# Patient Record
Sex: Female | Born: 1960 | Race: White | Hispanic: No | Marital: Married | Smoking: Never smoker
Health system: Southern US, Community
[De-identification: ages and names within clinical notes are randomized; demographics above are authoritative.]

## PROBLEM LIST (undated history)

## (undated) DIAGNOSIS — T148XXA Other injury of unspecified body region, initial encounter: Secondary | ICD-10-CM

## (undated) DIAGNOSIS — E7800 Pure hypercholesterolemia, unspecified: Secondary | ICD-10-CM

## (undated) DIAGNOSIS — Z9889 Other specified postprocedural states: Secondary | ICD-10-CM

## (undated) DIAGNOSIS — D649 Anemia, unspecified: Secondary | ICD-10-CM

## (undated) DIAGNOSIS — D696 Thrombocytopenia, unspecified: Secondary | ICD-10-CM

## (undated) DIAGNOSIS — R112 Nausea with vomiting, unspecified: Secondary | ICD-10-CM

## (undated) HISTORY — DX: Other specified postprocedural states: Z98.890

## (undated) HISTORY — DX: Anemia, unspecified: D64.9

## (undated) HISTORY — PX: SKIN SURGERY: SHX2413

## (undated) HISTORY — DX: Other specified postprocedural states: R11.2

## (undated) HISTORY — DX: Other injury of unspecified body region, initial encounter: T14.8XXA

## (undated) HISTORY — DX: Pure hypercholesterolemia, unspecified: E78.00

## (undated) HISTORY — DX: Thrombocytopenia, unspecified: D69.6

## (undated) SURGERY — Surgical Case
Anesthesia: *Unknown

---

## 2002-03-10 HISTORY — PX: HEMORROIDECTOMY: SUR656

## 2007-12-31 ENCOUNTER — Inpatient Hospital Stay (HOSPITAL_COMMUNITY): Admission: EM | Admit: 2007-12-31 | Discharge: 2008-01-03 | Payer: Self-pay | Admitting: Emergency Medicine

## 2008-01-01 ENCOUNTER — Ambulatory Visit: Payer: Self-pay | Admitting: Infectious Diseases

## 2009-03-10 HISTORY — PX: DILATION AND CURETTAGE OF UTERUS: SHX78

## 2010-01-24 ENCOUNTER — Ambulatory Visit: Payer: Self-pay | Admitting: Obstetrics & Gynecology

## 2010-03-01 ENCOUNTER — Ambulatory Visit (HOSPITAL_COMMUNITY)
Admission: RE | Admit: 2010-03-01 | Discharge: 2010-03-01 | Payer: Self-pay | Source: Home / Self Care | Attending: Obstetrics & Gynecology | Admitting: Obstetrics & Gynecology

## 2010-05-20 LAB — CBC
MCH: 27.8 pg (ref 26.0–34.0)
MCV: 84.7 fL (ref 78.0–100.0)
Platelets: 284 10*3/uL (ref 150–400)
WBC: 9.6 10*3/uL (ref 4.0–10.5)

## 2010-07-23 NOTE — Discharge Summary (Signed)
Alice Adams, Alice Adams NO.:  [id number]   MEDICAL RECORD NO.:  [id number]           PATIENT TYPE:   LOCATION:                                 FACILITY:   PHYSICIAN:  Renee Ramus, MD       DATE OF BIRTH:  07/21/1960   DATE OF ADMISSION:  DATE OF DISCHARGE:                               DISCHARGE SUMMARY   PRIMARY DISCHARGE DIAGNOSIS:  Malaria.   SECONDARY DIAGNOSES:  1. Thrombocytopenia.  2. Anemia.  3. Elevated LFTs.   HOSPITAL COURSE:  1. Malaria.  The patient is a 50 year old female admitted to the      emergency department after her primary care physician had diagnosed      her with malaria.  The patient had been in [place] for the      [employer] prior to being admitted.  The patient has a      longstanding history of abdominal pain, nausea, and vomiting, but      no specific fevers.  Upon admission, the patient was found to be      thrombocytopenic as well as anemic.  The patient was placed on      doxycycline and quinine sulfate.  She received consult from      Infectious Disease.  The patient's thrombocytopenia has improved.      Her symptoms have abated and she will be continued on this      antibiotic regimen for 2 weeks postdischarge.  The patient will      follow up with [facility] postdischarge within 1 week.  The patient      is now stable for discharge with, pending results G6PD level as      well as urine hCG.  2. Thrombocytopenia as above.  3. Anemia as above.   LABS OF NOTE:  1. Anemia with hemoglobin of 9.3 and hematocrit 28.3.  2. Thrombocytopenia initially with platelet count of 51, increasing to      102.  3. Initial hypokalemia with potassium of 3.2, increasing to 3.5 on the      day of discharge.  4. Elevated blood glucose of 104, decreasing to 86 on the day of      discharge.  5. Elevated LFTs with AST of 93 and ALT of 83, decreasing to 68 and 58      respectively on the day of discharge.  6. Hypoalbuminemia  with an albumin of 2.2.  7. Mild ketonuria in the urine.   STUDIES:  1. Chest x-ray showing no active cardiopulmonary disease.  2. Abdominal ultrasound showing no acute findings with no evidence of      splenomegaly and no evidence of acute cholecystitis, although      gallbladder sludge was noted.   DISCHARGE MEDICATIONS:  1. Quinine sulfate 648 mg p.o. t.i.d. x2 weeks.  2. Doxycycline 100 mg p.o. b.i.d. x2 weeks.  3. Temazepam 30 mg p.o. nightly.   There were no labs or studies pending at the time of discharge with the  exception of G6PD level.  The patient is in  stable condition and ready  for discharge.   TIME SPENT:  35 minutes.      Renee Ramus, MD  Electronically Signed     JF/MEDQ  D:  01/03/2008  T:  01/03/2008  Job:  [id number]

## 2010-07-23 NOTE — H&P (Signed)
Alice Adams, Alice Adams                ACCOUNT NO.:  [id number]   MEDICAL RECORD NO.:  [id number]          PATIENT TYPE:  INP   LOCATION:  1823                         FACILITY:  [facility]   PHYSICIAN:  Della Goo, M.D. DATE OF BIRTH:  10/12/1960   DATE OF ADMISSION:  12/31/2007  DATE OF DISCHARGE:                              HISTORY & PHYSICAL   PRIMARY CARE PHYSICIAN:  Unassigned.   CHIEF COMPLAINT:  Sick for 3 months.   HISTORY OF PRESENT ILLNESS:  This is a 50 year old female who was seen  by her primary care physician secondary to symptoms of illness, malaise,  bone pain and dark urine that she reports having over the past 3 months.  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  She had been in [place]  for 6 months.  She reports being seen by her primary care physician and  having laboratory studies sent.  She received a call from her primary  care physician to report to the emergency department after the findings  revealed malaria.  The patient also was found to have low platelets.  The patient was started on doxycycline and had one dose of doxycycline  100 mg prior to arriving.   PAST MEDICAL HISTORY:  None.   PAST SURGICAL HISTORY:  History of a hemorrhoidectomy.   MEDICATIONS:  1. Doxycycline 100 mg, one dose taken.  2. Temazepam 30 mg one p.o. q.h.s. p.r.n.   ALLERGIES:  NO KNOWN DRUG ALLERGIES   SOCIAL HISTORY:  She is a nonsmoker and she reports drinking one beer  occasionally.   FAMILY HISTORY:  Noncontributory.   REVIEW OF SYSTEMS:  Pertinents are mentioned above.  The patient  actually denies having fevers or chills.  She denies having any  diarrhea, dyspepsia, chest pain or shortness of breath.  She does have  weakness, malaise, fatigue and the symptoms mentioned above.   PHYSICAL EXAMINATION:  GENERAL:  This is a 50 year old thin, well-  developed female in discomfort, but no  acute distress.  VITAL SIGNS:  Temperature is 98.1, blood pressure 101/66, heart rate  102, respirations 18, O2 sats 96-99%.  HEENT:  Normocephalic, atraumatic.  Pupils are equally round and  reactive to light.  Extraocular movements are intact.  There is no  scleral icterus.  Oropharynx is clear.  NECK:  Supple, full range of motion.  No thyromegaly, adenopathy or  jugular venous distention.  CARDIOVASCULAR:  Mild tachycardia.  No murmurs, gallops or rubs.  LUNGS:  Clear to auscultation bilaterally.  ABDOMEN:  Positive bowel sounds, soft, nontender, nondistended.  There  is no palpable hepatosplenomegaly.  EXTREMITIES:  Without cyanosis, clubbing or edema.  NEUROLOGIC:  Nonfocal.   ASSESSMENT:  A 50 year old female being admitted with:  1. Reported diagnosis of malaria.  2. Thrombocytopenia.  3. Nausea and vomiting.  4. Weakness.   PLAN:  Laboratory studies are pending at this time.  A CBC along with  review of the blood smear will be performed.  Urine HCG will be sent  along  with a G6PD level.  The patient will be started on therapy of  quinine sulfate and doxycycline to treat an uncomplicated form of  falciparum malaria.  The patient will also be placed in isolation at  this time.  She will be placed on other medications to treat her  symptoms along with IV fluids for rehydration therapy.  Pain control  therapy and antiemetics have also been ordered.  Her blood count,  platelets and electrolytes will be monitored.  Infectious diseases will  also be consulted.      Della Goo, M.D.  Electronically Signed     HJ/MEDQ  D:  12/31/2007  T:  01/01/2008  Job:  [id number]

## 2010-10-24 ENCOUNTER — Other Ambulatory Visit: Payer: Self-pay | Admitting: Obstetrics & Gynecology

## 2010-10-29 ENCOUNTER — Ambulatory Visit: Payer: Self-pay | Admitting: Obstetrics & Gynecology

## 2010-12-10 LAB — DIFFERENTIAL
Basophils Absolute: 0.1
Basophils Absolute: 0.1
Basophils Relative: 1
Basophils Relative: 1
Basophils Relative: 1
Eosinophils Absolute: 0
Eosinophils Absolute: 0.1
Eosinophils Relative: 0
Eosinophils Relative: 1
Eosinophils Relative: 1
Lymphocytes Relative: 13
Lymphocytes Relative: 29
Lymphs Abs: 0.9
Lymphs Abs: 1.8
Monocytes Absolute: 0.7
Monocytes Absolute: 1.1 — ABNORMAL HIGH
Monocytes Relative: 13 — ABNORMAL HIGH
Monocytes Relative: 17 — ABNORMAL HIGH
Monocytes Relative: 9
Neutro Abs: 3.2
Neutro Abs: 5.6
Neutrophils Relative %: 52
Neutrophils Relative %: 62
Neutrophils Relative %: 77
Smear Review: DECREASED

## 2010-12-10 LAB — COMPREHENSIVE METABOLIC PANEL WITH GFR
ALT: 54 — ABNORMAL HIGH
AST: 57 — ABNORMAL HIGH
Albumin: 2 — ABNORMAL LOW
Alkaline Phosphatase: 56
BUN: 5 — ABNORMAL LOW
CO2: 25
Calcium: 7.7 — ABNORMAL LOW
Chloride: 102
Creatinine, Ser: 0.66
GFR calc Af Amer: >60
GFR calc non Af Amer: >60
Glucose, Bld: 114 — ABNORMAL HIGH
Potassium: 3.3 — ABNORMAL LOW
Sodium: 132 — ABNORMAL LOW
Total Bilirubin: 0.6
Total Protein: 5.1 — ABNORMAL LOW

## 2010-12-10 LAB — BASIC METABOLIC PANEL WITH GFR
BUN: 12
CO2: 25
Calcium: 8.1 — ABNORMAL LOW
Chloride: 95 — ABNORMAL LOW
Creatinine, Ser: 0.79
GFR calc Af Amer: >60
GFR calc non Af Amer: >60
Glucose, Bld: 97
Potassium: 3.2 — ABNORMAL LOW
Sodium: 130 — ABNORMAL LOW

## 2010-12-10 LAB — COMPREHENSIVE METABOLIC PANEL
ALT: 58 — ABNORMAL HIGH
ALT: 83 — ABNORMAL HIGH
AST: 68 — ABNORMAL HIGH
AST: 93 — ABNORMAL HIGH
Albumin: 2.2 — ABNORMAL LOW
BUN: 4 — ABNORMAL LOW
Calcium: 8.5
Creatinine, Ser: 0.63
GFR calc Af Amer: >60
Potassium: 3.5
Sodium: 135
Total Bilirubin: 0.8
Total Protein: 5.6 — ABNORMAL LOW

## 2010-12-10 LAB — URINALYSIS, ROUTINE W REFLEX MICROSCOPIC
Bilirubin Urine: NEGATIVE
Glucose, UA: NEGATIVE
Hgb urine dipstick: NEGATIVE
Ketones, ur: 15 — AB
Nitrite: NEGATIVE
Protein, ur: NEGATIVE
Specific Gravity, Urine: 1.011
Urobilinogen, UA: 1
pH: 6

## 2010-12-10 LAB — CBC
HCT: 28.3 — ABNORMAL LOW
HCT: 35.5 — ABNORMAL LOW
HCT: 35.5 — ABNORMAL LOW
Hemoglobin: 12
Hemoglobin: 12.1
Hemoglobin: 9.4 — ABNORMAL LOW
MCHC: 33.4
MCHC: 33.9
MCHC: 34
MCV: 82.4
MCV: 82.4
MCV: 83.1
MCV: 83.7
Platelets: 102 — ABNORMAL LOW
Platelets: 51 — ABNORMAL LOW
Platelets: DECREASED
RBC: 3.32 — ABNORMAL LOW
RBC: 3.38 — ABNORMAL LOW
RBC: 4.27
RBC: 4.31
RDW: 14.6
RDW: 14.6
RDW: 14.9
RDW: 15
WBC: 5.4
WBC: 6.1
WBC: 6.3
WBC: 7.3

## 2010-12-10 LAB — HEPATIC FUNCTION PANEL
ALT: 73 — ABNORMAL HIGH
Alkaline Phosphatase: 75
Indirect Bilirubin: 0.7
Total Bilirubin: 1
Total Protein: 6.4

## 2010-12-10 LAB — RETICULOCYTES
RBC.: 4.36
Retic Count, Absolute: 43.6

## 2010-12-10 LAB — HCG, SERUM, QUALITATIVE: Preg, Serum: NEGATIVE

## 2010-12-10 LAB — PATHOLOGIST SMEAR REVIEW

## 2011-01-13 ENCOUNTER — Encounter (HOSPITAL_COMMUNITY): Payer: Self-pay | Admitting: Pharmacy Technician

## 2011-01-15 ENCOUNTER — Other Ambulatory Visit: Payer: Self-pay | Admitting: Obstetrics & Gynecology

## 2011-01-15 ENCOUNTER — Encounter (HOSPITAL_COMMUNITY): Payer: Self-pay

## 2011-01-15 ENCOUNTER — Encounter (HOSPITAL_COMMUNITY)

## 2011-01-15 LAB — CBC
MCV: 86.2 fL (ref 78.0–100.0)
Platelets: 301 10*3/uL (ref 150–400)
RBC: 4.26 MIL/uL (ref 3.87–5.11)
WBC: 8.6 10*3/uL (ref 4.0–10.5)

## 2011-01-15 LAB — COMPREHENSIVE METABOLIC PANEL
ALT: 13 U/L (ref 0–35)
AST: 20 U/L (ref 0–37)
Albumin: 3.7 g/dL (ref 3.5–5.2)
Alkaline Phosphatase: 82 U/L (ref 39–117)
GFR calc Af Amer: >90 mL/min (ref >90)
Glucose, Bld: 88 mg/dL (ref 70–99)
Potassium: 4.2 mEq/L (ref 3.5–5.1)
Sodium: 136 mEq/L (ref 135–145)
Total Protein: 7.4 g/dL (ref 6.0–8.3)

## 2011-01-15 NOTE — Patient Instructions (Signed)
Alice Adams  01/15/2011   Your procedure is scheduled on: 01/24/11  Report to [facility] at 5:30 AM.  Call this number if you have problems the morning of surgery: [phone]   Remember:   Do not eat food:After Midnight.  Do not drink clear liquids: After Midnight.  Take these medicines the morning of surgery with A SIP OF WATER: NONE   Do not wear jewelry, make-up or nail polish.  Do not wear lotions, powders, or perfumes. You may wear deodorant.  Do not shave 48 hours prior to surgery.  Do not bring valuables to the hospital.  Contacts, dentures or bridgework may not be worn into surgery.  Leave suitcase in the car. After surgery it may be brought to your room.  For patients admitted to the hospital, checkout time is 11:00 AM the day of discharge.   Patients discharged the day of surgery will not be allowed to drive home.  Name and phone number of your driver:   Special Instructions: CHG Shower Use Special Wash: 1/2 bottle night before surgery and 1/2 bottle morning of surgery.   Please read over the following fact sheets that you were given: MRSA Information

## 2011-01-23 ENCOUNTER — Encounter (HOSPITAL_COMMUNITY): Payer: Self-pay | Admitting: Obstetrics & Gynecology

## 2011-01-23 DIAGNOSIS — D259 Leiomyoma of uterus, unspecified: Principal | ICD-10-CM | POA: Diagnosis present

## 2011-01-23 MED ORDER — CEFAZOLIN SODIUM 1-5 GM-% IV SOLN
1.0000 g | INTRAVENOUS | Status: AC
  Administered 2011-01-24: 1 g via INTRAVENOUS
  Filled 2011-01-23: qty 50

## 2011-01-23 NOTE — H&P (Signed)
  Subjective:  Alice Adams is a 50 y.o. parous female.  The gives a several year h/o of abnormal uterine bleeding.  Associated symptoms include left-sided pelvic pain.  Evaluation to date: lab: CBC with diff, Comprehensive metabolic panel and PT/PTT, endometrial biopsy: negative, D & C: negative, hysteroscopy: negative, pelvic ultrasound: positive for a 3 cm fundal myoma and pelvic CT scan: positive for a uterine myoma. Treatment to date: provera  Pertinent Gyn History: Menses flow is moderate and irregular Bleeding: dysfunctional uterine bleeding  Last pap: normal Date: 11/11  Patient Active Problem List  Diagnoses Date Noted  . Leiomyoma of uterus, unspecified 01/23/2011   Past Medical History  Diagnosis Date  . PONV (postoperative nausea and vomiting)   . Elevated cholesterol   . Asthma     NO PROBLEM SINCE 2006 -INVIRONMENTAL - SINCE MOVING FROM [place] TO [place]   . Malaria     TREATED FOR MALARIA 2009  . Anemia   . Skin abrasion     SMALL SCABS ON L LOWER LEG,L THIGH,RT THIGH - UNK CAUSE  . Thrombocytopenia     Past Surgical History  Procedure Date  . Dilation and curettage of uterus 2011  . Hemorroidectomy 2004    No prescriptions prior to admission   No Known Allergies  History  Substance Use Topics  . Smoking status: Never Smoker   . Smokeless tobacco: Not on file  . Alcohol Use: Yes     OCCASSIONAL    History reviewed. No pertinent family history.   Review of Systems Pertinent items are noted in HPI.    Objective:   Vital signs in last 24 hours:    General:   alert  Skin:   normal  HEENT:  PERRLA  Lungs:   clear to auscultation bilaterally  Heart:   regular rate and rhythm, S1, S2 normal, no murmur, click, rub or gallop  Breasts:   normal without suspicious masses, skin or nipple changes or axillary nodes  Abdomen:  soft, non-tender; bowel sounds normal; no masses,  no organomegaly  Pelvis:  Vulva and vagina appear normal. Bimanual exam reveals  normal uterus and adnexa.                                     Assessment/Plan:  Peritmenopausal with continued, irregular bleeding. Likely DUB/small fibroid uterus.  Remote h/o mild endometrial hyperplasia. A total robotic hysterectomy, possible BSO is planned    I had a lengthy discussion with the patient regarding her bleeding and consideration for hysterectomy.  Procedure, risks, reasons, benefits and complications (including injury to bowel, bladder, major blood vessel, ureter, bleeding, possibility of transfusion, infection, or fistula formation) were reviewed in detail. Consent was signed and preop testing was ordered.  Instructions were reviewed, including NPO after midnight.

## 2011-01-24 ENCOUNTER — Encounter (HOSPITAL_COMMUNITY)
Admission: RE | Disposition: A | Discharge status: Home / Self Care | Payer: Self-pay | Source: Ambulatory Visit | Attending: Obstetrics & Gynecology

## 2011-01-24 ENCOUNTER — Encounter (HOSPITAL_COMMUNITY): Payer: Self-pay | Admitting: Certified Registered Nurse Anesthetist

## 2011-01-24 ENCOUNTER — Inpatient Hospital Stay (HOSPITAL_COMMUNITY)
Admission: RE | Admit: 2011-01-24 | Discharge: 2011-01-25 | DRG: 743 | Disposition: A | Discharge status: Home / Self Care | Source: Ambulatory Visit | Attending: Obstetrics & Gynecology | Admitting: Obstetrics & Gynecology

## 2011-01-24 ENCOUNTER — Ambulatory Visit (HOSPITAL_COMMUNITY): Admitting: Certified Registered Nurse Anesthetist

## 2011-01-24 ENCOUNTER — Other Ambulatory Visit: Payer: Self-pay | Admitting: Obstetrics & Gynecology

## 2011-01-24 ENCOUNTER — Encounter (HOSPITAL_COMMUNITY): Payer: Self-pay | Admitting: *Deleted

## 2011-01-24 ENCOUNTER — Encounter (HOSPITAL_COMMUNITY): Payer: Self-pay | Admitting: Surgery

## 2011-01-24 DIAGNOSIS — Z8613 Personal history of malaria: Secondary | ICD-10-CM

## 2011-01-24 DIAGNOSIS — D259 Leiomyoma of uterus, unspecified: Principal | ICD-10-CM

## 2011-01-24 DIAGNOSIS — E7800 Pure hypercholesterolemia, unspecified: Secondary | ICD-10-CM | POA: Diagnosis present

## 2011-01-24 HISTORY — PX: ROBOTIC ASSISTED LAP VAGINAL HYSTERECTOMY: SHX2362

## 2011-01-24 LAB — CBC
HCT: 37.3 % (ref 36.0–46.0)
Hemoglobin: 12.4 g/dL (ref 12.0–15.0)
MCV: 84.8 fL (ref 78.0–100.0)
RBC: 4.4 MIL/uL (ref 3.87–5.11)
WBC: 18.5 10*3/uL — ABNORMAL HIGH (ref 4.0–10.5)

## 2011-01-24 LAB — BASIC METABOLIC PANEL
BUN: 7 mg/dL (ref 6–23)
CO2: 22 mEq/L (ref 19–32)
Chloride: 101 mEq/L (ref 96–112)
Creatinine, Ser: 0.45 mg/dL — ABNORMAL LOW (ref 0.50–1.10)
Potassium: 3.8 mEq/L (ref 3.5–5.1)

## 2011-01-24 LAB — TYPE AND SCREEN: Antibody Screen: NEGATIVE

## 2011-01-24 SURGERY — ROBOTIC ASSISTED LAPAROSCOPIC VAGINAL HYSTERECTOMY
Anesthesia: General | Site: Vagina | Wound class: Clean Contaminated

## 2011-01-24 MED ORDER — CEFAZOLIN SODIUM 1-5 GM-% IV SOLN
INTRAVENOUS | Status: AC
  Filled 2011-01-24: qty 50

## 2011-01-24 MED ORDER — DEXAMETHASONE SODIUM PHOSPHATE 10 MG/ML IJ SOLN
INTRAMUSCULAR | Status: DC | PRN
  Administered 2011-01-24: 10 mg via INTRAVENOUS

## 2011-01-24 MED ORDER — STERILE WATER FOR IRRIGATION IR SOLN
Status: DC | PRN
  Administered 2011-01-24: 1500 mL

## 2011-01-24 MED ORDER — LACTATED RINGERS IV SOLN: INTRAVENOUS | Status: DC

## 2011-01-24 MED ORDER — KETOROLAC TROMETHAMINE 30 MG/ML IJ SOLN
30.0000 mg | Freq: Four times a day (QID) | INTRAMUSCULAR | Status: DC
  Filled 2011-01-24 (×8): qty 1

## 2011-01-24 MED ORDER — HYDROMORPHONE HCL PF 1 MG/ML IJ SOLN
INTRAMUSCULAR | Status: AC
  Filled 2011-01-24: qty 1

## 2011-01-24 MED ORDER — KCL IN DEXTROSE-NACL 20-5-0.45 MEQ/L-%-% IV SOLN
INTRAVENOUS | Status: DC
  Administered 2011-01-24 – 2011-01-25 (×3): via INTRAVENOUS
  Filled 2011-01-24 (×6): qty 1000

## 2011-01-24 MED ORDER — PROMETHAZINE HCL 25 MG/ML IJ SOLN: 12.5000 mg | Freq: Four times a day (QID) | INTRAMUSCULAR | Status: DC | PRN

## 2011-01-24 MED ORDER — MAGNESIUM HYDROXIDE 400 MG/5ML PO SUSP
30.0000 mL | Freq: Three times a day (TID) | ORAL | Status: AC
  Administered 2011-01-25: 30 mL via ORAL
  Filled 2011-01-24 (×2): qty 30

## 2011-01-24 MED ORDER — LACTATED RINGERS IV SOLN
INTRAVENOUS | Status: DC | PRN
  Administered 2011-01-24: 250 mL

## 2011-01-24 MED ORDER — SCOPOLAMINE 1 MG/3DAYS TD PT72
MEDICATED_PATCH | TRANSDERMAL | Status: AC
  Filled 2011-01-24: qty 1

## 2011-01-24 MED ORDER — PROMETHAZINE HCL 25 MG/ML IJ SOLN: 6.2500 mg | INTRAMUSCULAR | Status: DC | PRN

## 2011-01-24 MED ORDER — BUPIVACAINE HCL (PF) 0.25 % IJ SOLN
INTRAMUSCULAR | Status: AC
  Filled 2011-01-24: qty 30

## 2011-01-24 MED ORDER — GLYCOPYRROLATE 0.2 MG/ML IJ SOLN
INTRAMUSCULAR | Status: DC | PRN
  Administered 2011-01-24: .8 mg via INTRAVENOUS

## 2011-01-24 MED ORDER — ACETAMINOPHEN 10 MG/ML IV SOLN
INTRAVENOUS | Status: DC | PRN
  Administered 2011-01-24: 1000 mg via INTRAVENOUS

## 2011-01-24 MED ORDER — ONDANSETRON HCL 4 MG/2ML IJ SOLN
INTRAMUSCULAR | Status: DC | PRN
  Administered 2011-01-24: 4 mg via INTRAVENOUS

## 2011-01-24 MED ORDER — HYDROMORPHONE HCL PF 1 MG/ML IJ SOLN
0.2500 mg | INTRAMUSCULAR | Status: DC | PRN
  Administered 2011-01-24 (×2): 0.25 mg via INTRAVENOUS

## 2011-01-24 MED ORDER — BUPIVACAINE HCL 0.25 % IJ SOLN
INTRAMUSCULAR | Status: DC | PRN
  Administered 2011-01-24: 8 mL

## 2011-01-24 MED ORDER — MIDAZOLAM HCL 5 MG/5ML IJ SOLN
INTRAMUSCULAR | Status: DC | PRN
  Administered 2011-01-24: 2 mg via INTRAVENOUS

## 2011-01-24 MED ORDER — HYDROMORPHONE HCL PF 1 MG/ML IJ SOLN
INTRAMUSCULAR | Status: DC | PRN
  Administered 2011-01-24: 1 mg via INTRAVENOUS
  Administered 2011-01-24: 0.5 mg via INTRAVENOUS

## 2011-01-24 MED ORDER — LACTATED RINGERS IV SOLN
INTRAVENOUS | Status: DC | PRN
  Administered 2011-01-24 (×3): via INTRAVENOUS

## 2011-01-24 MED ORDER — ROCURONIUM BROMIDE 100 MG/10ML IV SOLN
INTRAVENOUS | Status: DC | PRN
  Administered 2011-01-24: 10 mg via INTRAVENOUS
  Administered 2011-01-24: 50 mg via INTRAVENOUS
  Administered 2011-01-24 (×2): 10 mg via INTRAVENOUS

## 2011-01-24 MED ORDER — NEOSTIGMINE METHYLSULFATE 1 MG/ML IJ SOLN
INTRAMUSCULAR | Status: DC | PRN
  Administered 2011-01-24: 5 mg via INTRAVENOUS

## 2011-01-24 MED ORDER — ONDANSETRON HCL 4 MG/2ML IJ SOLN
4.0000 mg | Freq: Four times a day (QID) | INTRAMUSCULAR | Status: DC | PRN
  Administered 2011-01-24: 4 mg via INTRAVENOUS
  Filled 2011-01-24 (×2): qty 2

## 2011-01-24 MED ORDER — ACETAMINOPHEN 10 MG/ML IV SOLN
INTRAVENOUS | Status: AC
  Filled 2011-01-24: qty 100

## 2011-01-24 MED ORDER — MORPHINE SULFATE 2 MG/ML IJ SOLN: 1.0000 mg | INTRAMUSCULAR | Status: DC | PRN

## 2011-01-24 MED ORDER — KETOROLAC TROMETHAMINE 30 MG/ML IJ SOLN
30.0000 mg | Freq: Four times a day (QID) | INTRAMUSCULAR | Status: DC
  Administered 2011-01-24 – 2011-01-25 (×5): 30 mg via INTRAVENOUS
  Filled 2011-01-24 (×12): qty 1

## 2011-01-24 MED ORDER — ONDANSETRON HCL 4 MG PO TABS: 4.0000 mg | ORAL_TABLET | Freq: Four times a day (QID) | ORAL | Status: DC | PRN

## 2011-01-24 MED ORDER — FENTANYL CITRATE 0.05 MG/ML IJ SOLN
INTRAMUSCULAR | Status: DC | PRN
  Administered 2011-01-24 (×4): 50 ug via INTRAVENOUS
  Administered 2011-01-24: 100 ug via INTRAVENOUS
  Administered 2011-01-24: 50 ug via INTRAVENOUS

## 2011-01-24 MED ORDER — OXYCODONE-ACETAMINOPHEN 5-325 MG PO TABS
1.0000 | ORAL_TABLET | ORAL | Status: DC | PRN
  Administered 2011-01-24: 1 via ORAL
  Filled 2011-01-24: qty 1

## 2011-01-24 MED ORDER — ZOLPIDEM TARTRATE 5 MG PO TABS: 5.0000 mg | ORAL_TABLET | Freq: Every evening | ORAL | Status: DC | PRN

## 2011-01-24 MED ORDER — MORPHINE SULFATE 25 MG/ML IV SOLN: 1.0000 mg/h | INTRAVENOUS | Status: DC

## 2011-01-24 MED ORDER — SCOPOLAMINE 1 MG/3DAYS TD PT72
MEDICATED_PATCH | TRANSDERMAL | Status: DC | PRN
  Administered 2011-01-24: 1 via TRANSDERMAL

## 2011-01-24 MED ORDER — LIDOCAINE HCL (CARDIAC) 20 MG/ML IV SOLN
INTRAVENOUS | Status: DC | PRN
  Administered 2011-01-24: 80 mg via INTRAVENOUS

## 2011-01-24 MED ORDER — ESMOLOL HCL 10 MG/ML IV SOLN
INTRAVENOUS | Status: DC | PRN
  Administered 2011-01-24 (×2): 20 mg via INTRAVENOUS

## 2011-01-24 MED ORDER — PROPOFOL 10 MG/ML IV EMUL
INTRAVENOUS | Status: DC | PRN
  Administered 2011-01-24: 150 mg via INTRAVENOUS

## 2011-01-24 SURGICAL SUPPLY — 47 items
BENZOIN TINCTURE PRP APPL 2/3 (GAUZE/BANDAGES/DRESSINGS) ×3 IMPLANT
CHLORAPREP W/TINT 26ML (MISCELLANEOUS) ×3 IMPLANT
CLOTH BEACON ORANGE TIMEOUT ST (SAFETY) ×3 IMPLANT
CORDS BIPOLAR (ELECTRODE) ×3 IMPLANT
COVER SURGICAL LIGHT HANDLE (MISCELLANEOUS) ×3 IMPLANT
COVER TIP SHEARS 8 DVNC (MISCELLANEOUS) ×2 IMPLANT
COVER TIP SHEARS 8MM DA VINCI (MISCELLANEOUS) ×1
DECANTER SPIKE VIAL GLASS SM (MISCELLANEOUS) IMPLANT
DERMABOND ADVANCED (GAUZE/BANDAGES/DRESSINGS) ×1
DERMABOND ADVANCED .7 DNX12 (GAUZE/BANDAGES/DRESSINGS) ×2 IMPLANT
DRAPE SURG IRRIG POUCH 19X23 (DRAPES) ×3 IMPLANT
DRAPE UTILITY XL STRL (DRAPES) ×3 IMPLANT
DRSG TEGADERM 6X8 (GAUZE/BANDAGES/DRESSINGS) ×6 IMPLANT
ELECT REM PT RETURN 9FT ADLT (ELECTROSURGICAL) ×3
ELECTRODE REM PT RTRN 9FT ADLT (ELECTROSURGICAL) ×2 IMPLANT
FILTER SMOKE EVAC LAPAROSHD (FILTER) IMPLANT
GAUZE VASELINE 3X9 (GAUZE/BANDAGES/DRESSINGS) IMPLANT
GLOVE BIO SURGEON STRL SZ 6.5 (GLOVE) ×12 IMPLANT
GLOVE BIO SURGEON STRL SZ7.5 (GLOVE) ×6 IMPLANT
GLOVE BIO SURGEON STRL SZ8 (GLOVE) ×3 IMPLANT
GLOVE BIOGEL M 6.5 STRL (GLOVE) ×9 IMPLANT
GLOVE BIOGEL PI IND STRL 7.0 (GLOVE) ×4 IMPLANT
GLOVE BIOGEL PI INDICATOR 7.0 (GLOVE) ×2
GOWN PREVENTION PLUS XLARGE (GOWN DISPOSABLE) ×15 IMPLANT
HOLDER FOLEY CATH W/STRAP (MISCELLANEOUS) ×3 IMPLANT
KIT ACCESSORY DA VINCI DISP (KITS) ×1
KIT ACCESSORY DVNC DISP (KITS) ×2 IMPLANT
MANIPULATOR UTERINE 4.5 ZUMI (MISCELLANEOUS) IMPLANT
OCCLUDER COLPOPNEUMO (BALLOONS) ×3 IMPLANT
PACK ROBOTIC CUSTOM GYN (CUSTOM PROCEDURE TRAY) ×3 IMPLANT
POUCH SPECIMEN RETRIEVAL 10MM (ENDOMECHANICALS) IMPLANT
SET TUBE IRRIG SUCTION NO TIP (IRRIGATION / IRRIGATOR) ×3 IMPLANT
SOLUTION ELECTROLUBE (MISCELLANEOUS) ×3 IMPLANT
SPONGE LAP 18X18 X RAY DECT (DISPOSABLE) IMPLANT
STRIP CLOSURE SKIN 1/2X4 (GAUZE/BANDAGES/DRESSINGS) ×3 IMPLANT
SUT MNCRL AB 4-0 PS2 18 (SUTURE) IMPLANT
SUT VIC AB 0 CT1 27 (SUTURE) ×1
SUT VIC AB 0 CT1 27XBRD ANTBC (SUTURE) ×2 IMPLANT
SUT VIC AB 4-0 PS2 27 (SUTURE) ×6 IMPLANT
SUT VICRYL 0 UR6 27IN ABS (SUTURE) ×3 IMPLANT
SYR BULB IRRIGATION 50ML (SYRINGE) IMPLANT
TRAP SPECIMEN MUCOUS 40CC (MISCELLANEOUS) IMPLANT
TROCAR 12M 150ML BLUNT (TROCAR) ×3 IMPLANT
TROCAR BLADELESS OPT 5 75 (ENDOMECHANICALS) ×3 IMPLANT
TROCAR XCEL 12X100 BLDLESS (ENDOMECHANICALS) ×3 IMPLANT
TUBING FILTER THERMOFLATOR (ELECTROSURGICAL) ×3 IMPLANT
WATER STERILE IRR 1500ML POUR (IV SOLUTION) ×6 IMPLANT

## 2011-01-24 NOTE — Interval H&P Note (Signed)
History and Physical Interval Note:   01/24/2011   7:09 AM   Alice Adams  has presented today for surgery, with the diagnosis of Abnormal Uterine Bleeding/Uterine Fibroids  The various methods of treatment have been discussed with the patient and family. After consideration of risks, benefits and other options for treatment, the patient has consented to  Procedure(s): ROBOTIC ASSISTED LAPAROSCOPIC VAGINAL HYSTERECTOMY ROBOTIC ASSISTED TOTAL HYSTERECTOMY WITH SALPINGO OOPHERECTOMY as a surgical intervention .  The patients' history has been reviewed, patient examined, no change in status, stable for surgery.  I have reviewed the patients' chart and labs.  Questions were answered to the patient's satisfaction.     [name]  MD

## 2011-01-24 NOTE — Op Note (Addendum)
Pre-operative Diagnosis: abnormal uterine bleeding and fibroids  Post-operative Diagnosis: Same  Operation: Robotic-assisted hysterectomy  Surgeon: Antionette Char A  Assistant: Coral Ceo, MD  Anesthesia: GET  Urine Output: per Anesthesiology  Findings:  Globular, irregularly shaped uterus.  Normal ovaries, tubes  Estimated Blood Loss:  less than 100 mL                 Total IV Fluids: per Anesthesiology         Specimens: PATHOLOGY               Complications:  None; patient tolerated the procedure well.         Disposition: PACU - hemodynamically stable.         Condition: Stable    Procedure Details  The patient was seen in the Holding Room. The risks, benefits, complications, treatment options, and expected outcomes were discussed with the patient.  The patient concurred with the proposed plan, giving informed consent.  The site of surgery properly noted/marked. The patient was identified as Alice Adams and the procedure verified as a Robotic-assisted hysterectomy with bilateral salpingectomy. A Time Out was held and the above information confirmed.  After induction of anesthesia, the patient was draped and prepped in the usual sterile manner. Pt was placed in supine position after anesthesia and draped and prepped in the usual sterile manner. The abdominal drape was placed after the CholoraPrep had been allowed to dry for 3 minutes.  Her arms were tucked to her side with all appropriate precautions.  The shoulder blocks were placed in the usual fashion.  The patient was placed in the semi-lithotomy position in [place] stirrups.  The perineum was prepped with Betadine.  Foley catheter was placed.  A sterile speculum was placed in the vagina.  The cervix was grasped with a single-tooth tenaculum and dilated with Shawnie Pons dilators.  The ZUMI uterine manipulator with a medium colpotomizer ring was placed without difficulty.  A pneum occluder balloon was placed over the  manipulator.  A second time-out was performed.  OG tube placement was confirmed and to suction.  Approximately 2 cm below the costal margin, in the midclavicular line the skin was anesthestized with 0.25% Marcaine.  A 5 mm incision was made and using a 5 mm Optiview, a 5 mm trocar was placed under direct vision.  The patient's abdomen was insufflated with CO2 gas.  At this point and all points during the procedure, the patient's intra-abdominal pressure did not exceed 15 mmHg.  A 10-12 camera port was place 24 cm above the pubic symphysis.  Bilateral 8 mm ports were place 10 cm and 15 degrees inferior.  All ports were placed under direct visualization.  The 5 mm port was removed.  The incision was extended to accommodate a 10 mm trocar.  A 10 mm trocar and sleeve were advanced under direct visualization.   The robot was docked in the usual fashion.  The round ligament on the right was transected with monopolar cautery.  The anterior and posterior leaves of the broad ligament were opened.    A window was made between the infundibulopelvic ligament and the ureter.  The Fallopian tube and utero-ovarian ligament were coagulated with bipolar cautery and transected.  The uterine vessels were skeletonized to the level of the Koh ring.  A C-loop was created in the usual fashion.  A similar procedure was performed on the patient's left side. The round ligament on the left was transected with monopolar  cautery.  The anterior and posterior leaves of the broad ligament were opened.  The ureter was identified.  A window was made between the infundibulopelvic ligament and the ureter.  The Fallopian tube and utero-ovarian ligament were coagulated with bipolar cautery and transected.  The uterine vessels were skeletonized to the level of the Koh ring.  A C-loop was created in the usual fashion.   The bladder flap was completed.  At this time, the  pneumo occluder was inflated and a colpotomy was performed.  The uterus, cervix,  bilateral tubes and ovaries were delivered through the vagina.  All pedicles were inspected under low intraabdominal pressures and noted to be hemostatic.  The vagina cuff was closed using 0-Vicryl on a CT-1 needle in a running fashion.  The abdomen and pelvis were copiously irrigated.  The needle was removed without difficulty.  The instruments were then removed under direct visualization and the robotic ports removed.  The robot was undocked.  Deep, subcutaneous, figure-of-eight 0-Vicryl sutures on a UR-6 needle were placed in the 10-12 mm supraumbilical and infracostal incisions.  All skin incisions were closed in a subcuticular fashion using 3-0 Monocryl.  Steri-strips and Benzoin were applied.  The vagina was swabbed with minimal bleeding noted.   All instrument and needle counts were correct x  2.      Please note that only the uterus and cervix were delivered through the vagina.

## 2011-01-24 NOTE — Anesthesia Preprocedure Evaluation (Addendum)
Anesthesia Evaluation  Patient identified by MRN, date of birth, ID band Patient awake    Reviewed: Allergy & Precautions, H&P , NPO status , Patient's Chart, lab work & pertinent test results, reviewed documented beta blocker date and time   History of Anesthesia Complications (+) PONV and AWARENESS UNDER ANESTHESIA  Airway Mallampati: II TM Distance: >3 FB Neck ROM: full    Dental No notable dental hx. (+) Teeth Intact and Dental Advisory Given   Pulmonary neg pulmonary ROS, asthma ,  Asthma is allergic, mild.  Last 2007 clear to auscultation  Pulmonary exam normal       Cardiovascular Exercise Tolerance: Good neg cardio ROS regular Normal    Neuro/Psych Negative Neurological ROS  Negative Psych ROS   GI/Hepatic negative GI ROS, Neg liver ROS,   Endo/Other  Negative Endocrine ROS  Renal/GU negative Renal ROS  Genitourinary negative   Musculoskeletal   Abdominal   Peds  Hematology negative hematology ROS (+)   Anesthesia Other Findings Malaria thrombocytopenia  Reproductive/Obstetrics negative OB ROS                      Anesthesia Physical Anesthesia Plan  ASA: II  Anesthesia Plan: General   Post-op Pain Management:    Induction: Intravenous  Airway Management Planned: Oral ETT  Additional Equipment:   Intra-op Plan:   Post-operative Plan:   Informed Consent: I have reviewed the patients History and Physical, chart, labs and discussed the procedure including the risks, benefits and alternatives for the proposed anesthesia with the patient or authorized representative who has indicated his/her understanding and acceptance.   Dental Advisory Given  Plan Discussed with: CRNA and Surgeon  Anesthesia Plan Comments:        Anesthesia Quick Evaluation

## 2011-01-24 NOTE — Preoperative (Signed)
Beta Blockers   Reason not to administer Beta Blockers:Not Applicable 

## 2011-01-24 NOTE — Progress Notes (Signed)
Pt  Not given Milk of Mag due to nausea.

## 2011-01-24 NOTE — Transfer of Care (Signed)
Immediate Anesthesia Transfer of Care Note  Patient: Alice Adams  Procedure(s) Performed:  ROBOTIC ASSISTED LAPAROSCOPIC VAGINAL HYSTERECTOMY  Patient Location: PACU  Anesthesia Type: General  Level of Consciousness: sedated, patient cooperative and responds to stimulaton  Airway & Oxygen Therapy: Patient Spontanous Breathing and Patient connected to face mask oxgen  Post-op Assessment: Report given to PACU RN, Post -op Vital signs reviewed and stable and Patient moving all extremities X 4  Post vital signs: Reviewed and stable  Complications: No apparent anesthesia complications

## 2011-01-24 NOTE — Anesthesia Postprocedure Evaluation (Signed)
  Anesthesia Post-op Note  Patient: Alice Adams  Procedure(s) Performed:  ROBOTIC ASSISTED LAPAROSCOPIC VAGINAL HYSTERECTOMY  Patient Location: PACU  Anesthesia Type: General  Level of Consciousness: awake and alert   Airway and Oxygen Therapy: Patient Spontanous Breathing  Post-op Pain: mild  Post-op Assessment: Post-op Vital signs reviewed, Patient's Cardiovascular Status Stable, Respiratory Function Stable, Patent Airway and No signs of Nausea or vomiting  Post-op Vital Signs: stable  Complications: No apparent anesthesia complications

## 2011-01-25 LAB — BASIC METABOLIC PANEL
BUN: 5 mg/dL — ABNORMAL LOW (ref 6–23)
Chloride: 103 mEq/L (ref 96–112)
Creatinine, Ser: 0.6 mg/dL (ref 0.50–1.10)
GFR calc Af Amer: >90 mL/min (ref >90)
Glucose, Bld: 129 mg/dL — ABNORMAL HIGH (ref 70–99)

## 2011-01-25 LAB — CBC
HCT: 32.4 % — ABNORMAL LOW (ref 36.0–46.0)
Hemoglobin: 10.5 g/dL — ABNORMAL LOW (ref 12.0–15.0)
MCHC: 32.4 g/dL (ref 30.0–36.0)
MCV: 85.7 fL (ref 78.0–100.0)
RDW: 13.3 % (ref 11.5–15.5)
WBC: 13.4 10*3/uL — ABNORMAL HIGH (ref 4.0–10.5)

## 2011-01-25 MED ORDER — POLYETHYL GLYCOL-PROPYL GLYCOL 0.4-0.3 % OP SOLN: 1.0000 [drp] | Freq: Every day | OPHTHALMIC | Status: DC | PRN

## 2011-01-25 MED ORDER — POLYVINYL ALCOHOL 1.4 % OP SOLN
1.0000 [drp] | Freq: Every day | OPHTHALMIC | Status: DC | PRN
  Filled 2011-01-25: qty 15

## 2011-01-25 MED ORDER — MORPHINE SULFATE 2 MG/ML IJ SOLN: 2.0000 mg | INTRAMUSCULAR | Status: DC | PRN

## 2011-01-25 MED ORDER — OXYCODONE-ACETAMINOPHEN 5-325 MG PO TABS: 1.0000 | ORAL_TABLET | ORAL | Status: AC | PRN

## 2011-01-25 NOTE — Discharge Summary (Signed)
  Physician Discharge Summary  Patient ID: SOPHYA VANBLARCOM MRN: [id number] DOB/AGE: 15-May-1960 50 y.o.  Admit date: 01/24/2011 Discharge date: 01/25/2011  Admission Diagnoses:   Leiomyoma of uterus, unspecified   Discharge Diagnoses:  Active Problems:  Leiomyoma of uterus, unspecified   Discharged Condition: good  Hospital Course: The patient underwent a TRH.  The postoperative course was uneventful.  Consults: none  Significant Diagnostic Studies: none  Treatments: surgery: see above  Discharge Exam: Blood pressure 146/77, pulse 76, temperature 98.1 F (36.7 C), temperature source Oral, resp. rate 18, height 5\' 5"  (1.651 m), weight 72.576 kg (160 lb), last menstrual period 10/24/2010, SpO2 98.00%. General appearance: alert GI: soft, non-tender; bowel sounds normal; no masses,  no organomegaly  Disposition:   Discharge Orders    Future Orders Please Complete By Expires   Diet - low sodium heart healthy      Increase activity slowly      Driving Restrictions      Comments:   No driving x 1 week.   Lifting restrictions      Comments:   No lifting for 6 weeks   Sexual Activity Restrictions      Comments:   No intercourse for 8 weeks   Discharge wound care:      Comments:   Keep clean/dry   Call MD for:  temperature >100.4      Call MD for:  persistant nausea and vomiting      Call MD for:  severe uncontrolled pain      Call MD for:  redness, tenderness, or signs of infection (pain, swelling, redness, odor or green/yellow discharge around incision site)      Call MD for:  persistant dizziness or light-headedness      Call MD for:  extreme fatigue        Current Discharge Medication List    START taking these medications   Details  oxyCODONE-acetaminophen (PERCOCET) 5-325 MG per tablet Take 1-2 tablets by mouth every 3 (three) hours as needed (moderate to severe pain (when tolerating fluids)). Qty: 30 tablet, Refills: 0      CONTINUE these medications which  have NOT CHANGED   Details  Polyethyl Glycol-Propyl Glycol (SYSTANE) 0.4-0.3 % SOLN Apply 1 drop to eye daily as needed. DRY EYES      Aspirin-Salicylamide-Caffeine (BC HEADACHE POWDER PO) Take 1 packet by mouth every 6 (six) hours as needed. HEADACHE        Follow-up Information    Follow up with [name], MD. Make an appointment in 2 weeks.   Contact information:   [address] [place] [address] [phone]          Signed: [name] 01/25/2011, 4:13 PM

## 2011-01-25 NOTE — Progress Notes (Signed)
1 Day Post-Op Procedure(s) (LRB): ROBOTIC ASSISTED LAPAROSCOPIC VAGINAL HYSTERECTOMY (N/A)  Subjective: Patient reports no complaints.    Objective: Vital signs in last 24 hours: Temp:  [97.8 F (36.6 C)-98.5 F (36.9 C)] 98.1 F (36.7 C) (11/17 1350) Pulse Rate:  [76-86] 76  (11/17 1350) Resp:  [16-20] 18  (11/17 1350) BP: (120-148)/(65-79) 146/77 mmHg (11/17 1350) SpO2:  [96 %-99 %] 98 % (11/17 1350) Last BM Date: 01/23/11  Intake/Output from previous day: 11/16 0701 - 11/17 0700 In: 4125 [I.V.:3825] Out: 3400 [Urine:3150; Emesis/NG output:200; Blood:50] Intake/Output this shift: Total I/O In: 480 [P.O.:480] Out: 1300 [Urine:1050; Other:250]  Physical Examination:  General: alert GI: soft, non-tender; bowel sounds normal; no masses,  no organomegaly and incision: clean, dry and intact (all incisions)   Labs:  WBC/Hgb/Hct/Plts:  13.4/10.5/32.4/265 (11/17 1610) BUN/Cr/glu/ALT/AST/amyl/lip:  5/0.60/--/--/--/--/-- (11/17 9604)  Assessment:  50 y.o. s/p Procedure(s): ROBOTIC ASSISTED LAPAROSCOPIC VAGINAL HYSTERECTOMY: stable  Pain: Pain is well-controlled on  oral medications.   GI:  Tolerating po: Yes    Prophylaxis: intermittent pneumatic compression boots.  Plan: Discontinue IV fluids Discharge home   LOS: 1 day     JACKSON-MOORE,Alixis Harmon A 01/25/2011, 4:04 PM

## 2011-01-28 ENCOUNTER — Encounter (HOSPITAL_COMMUNITY): Payer: Self-pay | Admitting: Obstetrics & Gynecology

## 2011-03-24 ENCOUNTER — Other Ambulatory Visit: Payer: Self-pay | Admitting: Obstetrics & Gynecology

## 2013-01-31 ENCOUNTER — Encounter: Payer: Self-pay | Admitting: Obstetrics & Gynecology

## 2013-01-31 ENCOUNTER — Ambulatory Visit (INDEPENDENT_AMBULATORY_CARE_PROVIDER_SITE_OTHER): Admitting: Obstetrics & Gynecology

## 2013-01-31 VITALS — BP 167/98 | HR 82 | Temp 97.3°F | Ht 65.0 in | Wt 166.0 lb

## 2013-01-31 DIAGNOSIS — Z01419 Encounter for gynecological examination (general) (routine) without abnormal findings: Secondary | ICD-10-CM

## 2013-01-31 LAB — CBC
Platelets: 326 10*3/uL (ref 150–400)
RBC: 4.9 MIL/uL (ref 3.87–5.11)
WBC: 10.6 10*3/uL — ABNORMAL HIGH (ref 4.0–10.5)

## 2013-01-31 LAB — COMPREHENSIVE METABOLIC PANEL
ALT: 18 U/L (ref 0–35)
AST: 24 U/L (ref 0–37)
Albumin: 4.9 g/dL (ref 3.5–5.2)
Calcium: 10.4 mg/dL (ref 8.4–10.5)
Chloride: 100 mEq/L (ref 96–112)
Potassium: 5.2 mEq/L (ref 3.5–5.3)

## 2013-01-31 NOTE — Patient Instructions (Signed)

## 2013-01-31 NOTE — Progress Notes (Signed)
Subjective:     Alice Adams is a 52 y.o. female here for a routine annual exam.  Current complaints: no concerns at this time.  Personal health questionnaire reviewed: yes.   Gynecologic History Patient's last menstrual period was 01/09/2011. Contraception: status post hysterectomy Last Pap: 2012. Results were: normal post status hysterectomy  Last mammogram: 12/2011 Results were: normal  Obstetric History OB History  Gravida Para Term Preterm AB SAB TAB Ectopic Multiple Living  1 1 1       1     # Outcome Date GA Lbr Len/2nd Weight Sex Delivery Anes PTL Lv  1 TRM 05/14/85 [redacted]w[redacted]d  7 lb 11 oz (3.487 kg) M SVD   Y       The following portions of the patient's history were reviewed and updated as appropriate: allergies, current medications, past family history, past medical history, past social history, past surgical history and problem list.  Review of Systems Pertinent items are noted in HPI.    Objective:      General appearance: alert Breasts: normal appearance, no masses or tenderness Abdomen: soft, minimal LLQ tenderness; bowel sounds normal; no masses,  no organomegaly Pelvic:  vagina normal without discharge      Informal pelvic U/S: no masses, ovaries not well visualized Assessment:    Healthy female exam.    Plan:    Education reviewed: [facility], screening colonoscopy. Referral-->primay care provider Return prn

## 2013-02-01 LAB — NMR LIPOPROFILE WITH LIPIDS
HDL Size: 8.7 nm — ABNORMAL LOW (ref >=9.2)
LDL Size: 20.4 nm — ABNORMAL LOW (ref >20.5)
Large HDL-P: 4.2 umol/L — ABNORMAL LOW (ref >=4.8)
Large VLDL-P: 5.2 nmol/L — ABNORMAL HIGH (ref <=2.7)

## 2013-02-01 LAB — TSH: TSH: 2.309 u[IU]/mL (ref 0.350–4.500)

## 2014-01-09 ENCOUNTER — Encounter: Payer: Self-pay | Admitting: Obstetrics & Gynecology

## 2014-03-06 ENCOUNTER — Encounter: Payer: Self-pay | Admitting: *Deleted

## 2014-03-07 ENCOUNTER — Encounter: Payer: Self-pay | Admitting: Obstetrics & Gynecology

## 2014-07-03 ENCOUNTER — Emergency Department (HOSPITAL_BASED_OUTPATIENT_CLINIC_OR_DEPARTMENT_OTHER)
Admission: EM | Admit: 2014-07-03 | Discharge: 2014-07-03 | Disposition: A | Discharge status: Home / Self Care | Attending: Emergency Medicine | Admitting: Emergency Medicine

## 2014-07-03 ENCOUNTER — Emergency Department (HOSPITAL_BASED_OUTPATIENT_CLINIC_OR_DEPARTMENT_OTHER)

## 2014-07-03 ENCOUNTER — Encounter (HOSPITAL_BASED_OUTPATIENT_CLINIC_OR_DEPARTMENT_OTHER): Payer: Self-pay | Admitting: *Deleted

## 2014-07-03 DIAGNOSIS — Z862 Personal history of diseases of the blood and blood-forming organs and certain disorders involving the immune mechanism: Secondary | ICD-10-CM | POA: Insufficient documentation

## 2014-07-03 DIAGNOSIS — Z8613 Personal history of malaria: Secondary | ICD-10-CM | POA: Insufficient documentation

## 2014-07-03 DIAGNOSIS — Z8639 Personal history of other endocrine, nutritional and metabolic disease: Secondary | ICD-10-CM | POA: Insufficient documentation

## 2014-07-03 DIAGNOSIS — R4182 Altered mental status, unspecified: Secondary | ICD-10-CM | POA: Diagnosis present

## 2014-07-03 DIAGNOSIS — J45909 Unspecified asthma, uncomplicated: Secondary | ICD-10-CM | POA: Diagnosis not present

## 2014-07-03 LAB — CBC WITH DIFFERENTIAL/PLATELET
BASOS PCT: 0 % (ref 0–1)
Basophils Absolute: 0 10*3/uL (ref 0.0–0.1)
EOS ABS: 0.4 10*3/uL (ref 0.0–0.7)
Eosinophils Relative: 4 % (ref 0–5)
HEMATOCRIT: 42.6 % (ref 36.0–46.0)
HEMOGLOBIN: 14.6 g/dL (ref 12.0–15.0)
LYMPHS ABS: 2.5 10*3/uL (ref 0.7–4.0)
Lymphocytes Relative: 25 % (ref 12–46)
MCH: 29.9 pg (ref 26.0–34.0)
MCHC: 34.3 g/dL (ref 30.0–36.0)
MCV: 87.1 fL (ref 78.0–100.0)
MONO ABS: 1.2 10*3/uL — AB (ref 0.1–1.0)
MONOS PCT: 12 % (ref 3–12)
NEUTROS PCT: 59 % (ref 43–77)
Neutro Abs: 5.9 10*3/uL (ref 1.7–7.7)
Platelets: 329 10*3/uL (ref 150–400)
RBC: 4.89 MIL/uL (ref 3.87–5.11)
RDW: 12.4 % (ref 11.5–15.5)
WBC: 10 10*3/uL (ref 4.0–10.5)

## 2014-07-03 LAB — ETHANOL

## 2014-07-03 LAB — URINALYSIS, ROUTINE W REFLEX MICROSCOPIC
Bilirubin Urine: NEGATIVE
GLUCOSE, UA: NEGATIVE mg/dL
Hgb urine dipstick: NEGATIVE
KETONES UR: NEGATIVE mg/dL
LEUKOCYTES UA: NEGATIVE
NITRITE: NEGATIVE
PROTEIN: NEGATIVE mg/dL
SPECIFIC GRAVITY, URINE: 1.002 — AB (ref 1.005–1.030)
Urobilinogen, UA: 0.2 mg/dL (ref 0.0–1.0)
pH: 5.5 (ref 5.0–8.0)

## 2014-07-03 LAB — COMPREHENSIVE METABOLIC PANEL
ALBUMIN: 4.6 g/dL (ref 3.5–5.2)
ALT: 19 U/L (ref 0–35)
AST: 28 U/L (ref 0–37)
Alkaline Phosphatase: 96 U/L (ref 39–117)
Anion gap: 9 (ref 5–15)
BUN: 8 mg/dL (ref 6–23)
CALCIUM: 9.7 mg/dL (ref 8.4–10.5)
CO2: 24 mmol/L (ref 19–32)
Chloride: 105 mmol/L (ref 96–112)
Creatinine, Ser: 0.61 mg/dL (ref 0.50–1.10)
GFR calc Af Amer: >90 mL/min (ref >90)
Glucose, Bld: 113 mg/dL — ABNORMAL HIGH (ref 70–99)
Potassium: 3.8 mmol/L (ref 3.5–5.1)
Sodium: 138 mmol/L (ref 135–145)
TOTAL PROTEIN: 8.4 g/dL — AB (ref 6.0–8.3)
Total Bilirubin: 0.6 mg/dL (ref 0.3–1.2)

## 2014-07-03 LAB — RAPID URINE DRUG SCREEN, HOSP PERFORMED
Amphetamines: NOT DETECTED
BARBITURATES: NOT DETECTED
BENZODIAZEPINES: NOT DETECTED
Cocaine: NOT DETECTED
Opiates: NOT DETECTED
Tetrahydrocannabinol: NOT DETECTED

## 2014-07-03 NOTE — Discharge Instructions (Signed)
Return to the emergency department if you develop any new or concerning symptoms.  Be sure to follow-up with your primary Dr. in the next week to be rechecked.   Confusion Confusion is the inability to think with your usual speed or clarity. Confusion may come on quickly or slowly over time. How quickly the confusion comes on depends on the cause. Confusion can be due to any number of causes. CAUSES   Concussion, head injury, or head trauma.  Seizures.  Stroke.  Fever.  Brain tumor.  Age related decreased brain function (dementia).  Heightened emotional states like rage or terror.  Mental illness in which the person loses the ability to determine what is real and what is not (hallucinations).  Infections such as a urinary tract infection (UTI).  Toxic effects from alcohol, drugs, or prescription medicines.  Dehydration and an imbalance of salts in the body (electrolytes).  Lack of sleep.  Low blood sugar (diabetes).  Low levels of oxygen from conditions such as chronic lung disorders.  Drug interactions or other medicine side effects.  Nutritional deficiencies, especially niacin, thiamine, vitamin C, or vitamin B.  Sudden drop in body temperature (hypothermia).  Change in routine, such as when traveling or hospitalized. SIGNS AND SYMPTOMS  People often describe their thinking as cloudy or unclear when they are confused. Confusion can also include feeling disoriented. That means you are unaware of where or who you are. You may also not know what the date or time is. If confused, you may also have difficulty paying attention, remembering, and making decisions. Some people also act aggressively when they are confused.  DIAGNOSIS  The medical evaluation of confusion may include:  Blood and urine tests.  X-rays.  Brain and nervous system tests.  Analyzing your brain waves (electroencephalogram or EEG).  Magnetic resonance imaging (MRI) of your head.  Computed  tomography (CT) scan of your head.  Mental status tests in which your health care provider may ask many questions. Some of these questions may seem silly or strange, but they are a very important test to help diagnose and treat confusion. TREATMENT  An admission to the hospital may not be needed, but a person with confusion should not be left alone. Stay with a family member or friend until the confusion clears. Avoid alcohol, pain relievers, or sedative drugs until you have fully recovered. Do not drive until directed by your health care provider. HOME CARE INSTRUCTIONS  What family and friends can do:  To find out if someone is confused, ask the person to state his or her name, age, and the date. If the person is unsure or answers incorrectly, he or she is confused.  Always introduce yourself, no matter how well the person knows you.  Often remind the person of his or her location.  Place a calendar and clock near the confused person.  Help the person with his or her medicines. You may want to use a pill box, an alarm as a reminder, or give the person each dose as prescribed.  Talk about current events and plans for the day.  Try to keep the environment calm, quiet, and peaceful.  Make sure the person keeps follow-up visits with his or her health care provider. PREVENTION  Ways to prevent confusion:  Avoid alcohol.  Eat a balanced diet.  Get enough sleep.  Take medicine only as directed by your health care provider.  Do not become isolated. Spend time with other people and make plans for  your days.  Keep careful watch on your blood sugar levels if you are diabetic. SEEK IMMEDIATE MEDICAL CARE IF:   You develop severe headaches, repeated vomiting, seizures, blackouts, or slurred speech.  There is increasing confusion, weakness, numbness, restlessness, or personality changes.  You develop a loss of balance, have marked dizziness, feel uncoordinated, or fall.  You have  delusions, hallucinations, or develop severe anxiety.  Your family members think you need to be rechecked. Document Released: 04/03/2004 Document Revised: 07/11/2013 Document Reviewed: 04/01/2013 [employer] Patient Information 2015 [place], [place]. This information is not intended to replace advice given to you by your health care provider. Make sure you discuss any questions you have with your health care provider.

## 2014-07-03 NOTE — ED Notes (Signed)
Feeling tired. Drove herself here. Drove by this am and went home. States she is having a hard time making decisions. She seems confused at triage. Changes her complaints and order of events. She denies drug use or abuse.

## 2014-07-03 NOTE — ED Provider Notes (Signed)
CSN: [id number]     Arrival date & time 07/03/14  1912 History  This chart was scribed for Veryl Speak, MD by Stephania Fragmin, ED Scribe. This patient was seen in room MH03/MH03 and the patient's care was started at 7:37 PM.    Chief Complaint  Patient presents with  . Altered Mental Status   The history is provided by the patient. No language interpreter was used.    LEVEL 5 CAVEAT DUE TO ALTERED MENTAL STATUS  HPI Comments: Alice Adams is a 54 y.o. female who presents to the Emergency Department for altered mental status.   Patient complains of malaise, and states she is worried she may have some viral infection. She has been taking care of and having frequent contact with her mother recently who normally has bronchitis this time of year.   Patient was taking metropolol before going to [place] 2009-2011. She states she caught malaria while there and was treated here. She has XRs and blood tests done, which was positive for malaria.     Patient reports stressors at home, and states her husband has lost a lot of weight. Patient's husband has had bowel incontinence/uncontrolled diarrhea. She lives at home alone with her husband, although she lives next door to her mother, which adds stress. Her husband doesn't know she is here.   Patient reports occasional EtOH consumption. However, she denies illicit drug use.     She denies SI or HI. She denies auditory or visual hallucinations.   She denies any new medications or recent medication changes. She denies diarrhea or vomiting.   Dr. Olevia Bowens at [facility] is her PCP.  Past Medical History  Diagnosis Date  . PONV (postoperative nausea and vomiting)   . Elevated cholesterol   . Asthma     NO PROBLEM SINCE 2006 -INVIRONMENTAL - SINCE MOVING FROM NEW [place] TO [place]   . Malaria     TREATED FOR MALARIA 2009  . Anemia   . Skin abrasion     SMALL SCABS ON L LOWER LEG,L THIGH,RT THIGH - UNK CAUSE  . Thrombocytopenia    Past Surgical  History  Procedure Laterality Date  . Dilation and curettage of uterus  2011  . Hemorroidectomy  2004  . Robotic assisted lap vaginal hysterectomy  01/24/2011    Procedure: ROBOTIC ASSISTED LAPAROSCOPIC VAGINAL HYSTERECTOMY;  Surgeon: Agnes Lawrence, MD;  Location: [facility];  Service: Gynecology;  Laterality: N/A;  . Skin surgery      removal basal cell carcinoma   Family History  Problem Relation Age of Onset  . Diabetes Mother   . Hypertension Mother    History  Substance Use Topics  . Smoking status: Never Smoker   . Smokeless tobacco: Never Used  . Alcohol Use: Yes     Comment: OCCASSIONAL   OB History    Gravida Para Term Preterm AB TAB SAB Ectopic Multiple Living   1 1 1       1      Review of Systems  Unable to perform ROS: Mental status change   Allergies  Review of patient's allergies indicates no known allergies.  Home Medications   Prior to Admission medications   Medication Sig Start Date End Date Taking? Authorizing Provider  Polyethyl Glycol-Propyl Glycol (SYSTANE) 0.4-0.3 % SOLN Apply 1 drop to eye daily as needed. DRY EYES      Historical Provider, MD   BP 156/94 mmHg  Pulse 94  Temp(Src) 98.1 F (36.7  C) (Oral)  Resp 18  Ht 5\' 6"  (1.676 m)  Wt 157 lb (71.215 kg)  BMI 25.35 kg/m2  SpO2 99%  LMP 01/09/2011 Physical Exam  Constitutional: She is oriented to person, place, and time. She appears well-developed and well-nourished. No distress.  HENT:  Head: Normocephalic and atraumatic.  Right Ear: Hearing normal.  Left Ear: Hearing normal.  Nose: Nose normal.  Mouth/Throat: Oropharynx is clear and moist and mucous membranes are normal.  Eyes: Conjunctivae and EOM are normal. Pupils are equal, round, and reactive to light.  Neck: Normal range of motion. Neck supple.  Cardiovascular: Regular rhythm, S1 normal and S2 normal.  Exam reveals no gallop and no friction rub.   No murmur heard. Pulmonary/Chest: Effort normal and breath sounds normal.  No respiratory distress. She exhibits no tenderness.  Abdominal: Soft. Normal appearance and bowel sounds are normal. There is no hepatosplenomegaly. There is no tenderness. There is no rebound, no guarding, no tenderness at McBurney's point and negative Murphy's sign. No hernia.  Musculoskeletal: Normal range of motion.  Neurological: She is alert and oriented to person, place, and time. She has normal strength. No cranial nerve deficit or sensory deficit. Coordination normal. GCS eye subscore is 4. GCS verbal subscore is 5. GCS motor subscore is 6.  Skin: Skin is warm, dry and intact. No rash noted. No cyanosis.  Psychiatric: Thought content normal. Her affect is labile. Her speech is tangential. She is withdrawn. She exhibits a depressed mood. She expresses no homicidal and no suicidal ideation. She expresses no suicidal plans and no homicidal plans.  Nursing note and vitals reviewed.   ED Course  Procedures (including critical care time)  DIAGNOSTIC STUDIES: Oxygen Saturation is 99% on RA, normal by my interpretation.    COORDINATION OF CARE: 7:45 PM - Discussed treatment plan with pt at bedside which includes diagnostic tests, and pt agreed to plan.  Labs Review Labs Reviewed  URINALYSIS, ROUTINE W REFLEX MICROSCOPIC - Abnormal; Notable for the following:    Specific Gravity, Urine 1.002 (*)    All other components within normal limits  COMPREHENSIVE METABOLIC PANEL - Abnormal; Notable for the following:    Glucose, Bld 113 (*)    Total Protein 8.4 (*)    All other components within normal limits  CBC WITH DIFFERENTIAL/PLATELET - Abnormal; Notable for the following:    Monocytes Absolute 1.2 (*)    All other components within normal limits  URINE RAPID DRUG SCREEN (HOSP PERFORMED)  ETHANOL    Imaging Review Ct Head Wo Contrast  07/03/2014   CLINICAL DATA:  Malaise.  EXAM: CT HEAD WITHOUT CONTRAST  TECHNIQUE: Contiguous axial images were obtained from the base of the skull  through the vertex without intravenous contrast.  COMPARISON:  None.  FINDINGS: A presumed prominent VR space is noted caudal to the left basal ganglia (image 13, series 2). Gray-white differentiation is maintained without CT evidence of acute large territory infarct. No intraparenchymal or extra-axial mass or hemorrhage. Normal size and configuration of the ventricles and basilar cisterns. No midline shift. Intracranial atherosclerosis. There is under pneumatization of the left frontal sinus. The remaining paranasal sinuses and mastoid air cells are normally aerated. No air-fluid levels. Regional soft tissues appear normal. No displaced calvarial fracture.  IMPRESSION: No acute intracranial process.   Electronically Signed   By: Sandi Mariscal M.D.   On: 07/03/2014 21:07     MDM   Final diagnoses:  None   Patient presents with difficulty concentrating,  weakness, and generalized malaise.  She appears emotionally labile and possibly depressed, however she denies any SI, HI, AH, or VH.  Medical workup is unremarkable.  She admits to the possibility that she may be depressed, however declines offers for TTS consultation.  She mentions her husband on several occasions who is at home, however she denies feeling in danger or unsafe.  She is also concerned that he has lost weight and may not be in the best of health.  She appears anxious and I believe anxiety / depression may play a role in her presentation this evening.  As she does not appear to be a danger to herself, others, and declines offers to speak with a counselor, I feel as though she is appropriate for discharge.    I personally performed the services described in this documentation, which was scribed in my presence. The recorded information has been reviewed and is accurate.     Veryl Speak, MD 07/04/14 [phone]

## 2015-02-02 ENCOUNTER — Encounter (HOSPITAL_COMMUNITY): Payer: Self-pay

## 2015-02-02 ENCOUNTER — Emergency Department (HOSPITAL_COMMUNITY)
Admission: EM | Admit: 2015-02-02 | Discharge: 2015-02-03 | Disposition: A | Discharge status: Other Acute Inpatient Hospital | Attending: Emergency Medicine | Admitting: Emergency Medicine

## 2015-02-02 DIAGNOSIS — Z862 Personal history of diseases of the blood and blood-forming organs and certain disorders involving the immune mechanism: Secondary | ICD-10-CM | POA: Insufficient documentation

## 2015-02-02 DIAGNOSIS — F419 Anxiety disorder, unspecified: Secondary | ICD-10-CM | POA: Diagnosis not present

## 2015-02-02 DIAGNOSIS — Z8613 Personal history of malaria: Secondary | ICD-10-CM | POA: Diagnosis not present

## 2015-02-02 DIAGNOSIS — Z8639 Personal history of other endocrine, nutritional and metabolic disease: Secondary | ICD-10-CM | POA: Diagnosis not present

## 2015-02-02 DIAGNOSIS — R45851 Suicidal ideations: Secondary | ICD-10-CM | POA: Diagnosis present

## 2015-02-02 DIAGNOSIS — Z87828 Personal history of other (healed) physical injury and trauma: Secondary | ICD-10-CM | POA: Diagnosis not present

## 2015-02-02 DIAGNOSIS — F333 Major depressive disorder, recurrent, severe with psychotic symptoms: Secondary | ICD-10-CM | POA: Diagnosis not present

## 2015-02-02 DIAGNOSIS — J45909 Unspecified asthma, uncomplicated: Secondary | ICD-10-CM | POA: Insufficient documentation

## 2015-02-02 LAB — RAPID URINE DRUG SCREEN, HOSP PERFORMED
Amphetamines: NOT DETECTED
BARBITURATES: NOT DETECTED
Benzodiazepines: NOT DETECTED
COCAINE: NOT DETECTED
Opiates: NOT DETECTED
TETRAHYDROCANNABINOL: NOT DETECTED

## 2015-02-02 LAB — COMPREHENSIVE METABOLIC PANEL
ALBUMIN: 4.6 g/dL (ref 3.5–5.0)
ALK PHOS: 64 U/L (ref 38–126)
ALT: 21 U/L (ref 14–54)
AST: 29 U/L (ref 15–41)
Anion gap: 11 (ref 5–15)
BILIRUBIN TOTAL: 0.8 mg/dL (ref 0.3–1.2)
BUN: 14 mg/dL (ref 6–20)
CALCIUM: 10.1 mg/dL (ref 8.9–10.3)
CO2: 27 mmol/L (ref 22–32)
CREATININE: 0.78 mg/dL (ref 0.44–1.00)
Chloride: 102 mmol/L (ref 101–111)
GFR calc non Af Amer: >60 mL/min (ref >60)
GLUCOSE: 94 mg/dL (ref 65–99)
Potassium: 4.6 mmol/L (ref 3.5–5.1)
SODIUM: 140 mmol/L (ref 135–145)
Total Protein: 7.9 g/dL (ref 6.5–8.1)

## 2015-02-02 LAB — CBC WITH DIFFERENTIAL/PLATELET
BASOS ABS: 0 10*3/uL (ref 0.0–0.1)
BASOS PCT: 0 %
Eosinophils Absolute: 0.1 10*3/uL (ref 0.0–0.7)
Eosinophils Relative: 1 %
HEMATOCRIT: 42.4 % (ref 36.0–46.0)
HEMOGLOBIN: 14.1 g/dL (ref 12.0–15.0)
LYMPHS PCT: 21 %
Lymphs Abs: 2.1 10*3/uL (ref 0.7–4.0)
MCH: 29.9 pg (ref 26.0–34.0)
MCHC: 33.3 g/dL (ref 30.0–36.0)
MCV: 90 fL (ref 78.0–100.0)
MONO ABS: 1 10*3/uL (ref 0.1–1.0)
Monocytes Relative: 10 %
Neutro Abs: 6.5 10*3/uL (ref 1.7–7.7)
Neutrophils Relative %: 68 %
Platelets: 327 10*3/uL (ref 150–400)
RBC: 4.71 MIL/uL (ref 3.87–5.11)
RDW: 12.6 % (ref 11.5–15.5)
WBC: 9.7 10*3/uL (ref 4.0–10.5)

## 2015-02-02 LAB — ETHANOL: Alcohol, Ethyl (B): <5 mg/dL (ref <5)

## 2015-02-02 LAB — ACETAMINOPHEN LEVEL

## 2015-02-02 LAB — SALICYLATE LEVEL

## 2015-02-02 MED ORDER — ONDANSETRON HCL 4 MG PO TABS: 4.0000 mg | ORAL_TABLET | Freq: Three times a day (TID) | ORAL | Status: DC | PRN

## 2015-02-02 MED ORDER — ALUM & MAG HYDROXIDE-SIMETH 200-200-20 MG/5ML PO SUSP: 30.0000 mL | ORAL | Status: DC | PRN

## 2015-02-02 MED ORDER — LORAZEPAM 1 MG PO TABS
1.0000 mg | ORAL_TABLET | Freq: Once | ORAL | Status: AC
  Administered 2015-02-02: 1 mg via ORAL
  Filled 2015-02-02: qty 1

## 2015-02-02 MED ORDER — ACETAMINOPHEN 325 MG PO TABS: 650.0000 mg | ORAL_TABLET | ORAL | Status: DC | PRN

## 2015-02-02 MED ORDER — LORAZEPAM 1 MG PO TABS: 1.0000 mg | ORAL_TABLET | Freq: Three times a day (TID) | ORAL | Status: DC | PRN

## 2015-02-02 MED ORDER — IBUPROFEN 200 MG PO TABS: 600.0000 mg | ORAL_TABLET | Freq: Three times a day (TID) | ORAL | Status: DC | PRN

## 2015-02-02 NOTE — ED Notes (Signed)
Pt. Noted in room talking to TTS via Telepsych. No complaints or concerns voiced. No distress or abnormal behavior noted. Will continue to monitor with security cameras. Q 15 minute rounds continue.

## 2015-02-02 NOTE — ED Provider Notes (Signed)
CSN: [id number]     Arrival date & time 02/02/15  1745 History   First MD Initiated Contact with Patient 02/02/15 1817     Chief Complaint  Patient presents with  . Suicidal     (Consider location/radiation/quality/duration/timing/severity/associated sxs/prior Treatment) HPI Comments: Alice Adams is a(n) 54 y.o. female who is brought in by her family for abnormal behavior. She  has a past medical history of PONV (postoperative nausea and vomiting); Elevated cholesterol; Asthma; Malaria; Anemia; Skin abrasion; and Thrombocytopenia ([place]). She has a family history of a sister who has been institutionalized due to schizophrenia since her early 64s and a father who completed suicide by GSW to the head. She has no known psychiatric history. The patient has had 2 months of worsening, reclusive behavior including refusal to leave the home, drawing all the window shades. He husband states that she paces the floors all day and night ringing her hands. She is barely able to speak full sentences and is slow to speak . The patient does admit that she tried to kill herself in June or July of 2016 by asphyxiation with a belt. She states that she has had obsessive thoughts of death since that time. She states that she blacked out but "my body fought back." Her family members state that she is delusional at times and paranoid.She sometimes complains of bugs crawling under her skin. She is on no medications. She has been evaluated twice before and diagnosed with Altered mental status.   Review of medical records shows that she was seen for the same complaint in April of 2016  With a negative workup. She declined psych eval at that time. Today she is attended by 3 family members who are very concerned. LEVEL 5 CAVEAT DUE TO ALTERED MENTAL STATUS.  The history is provided by the spouse, the patient and a relative. The history is limited by the condition of the patient.    Past Medical History  Diagnosis Date  .  PONV (postoperative nausea and vomiting)   . Elevated cholesterol   . Asthma     NO PROBLEM SINCE 2006 -INVIRONMENTAL - SINCE MOVING FROM [place] TO [place]   . Malaria     TREATED FOR MALARIA 2009  . Anemia   . Skin abrasion     SMALL SCABS ON L LOWER LEG,L THIGH,RT THIGH - UNK CAUSE  . Thrombocytopenia [facility])    Past Surgical History  Procedure Laterality Date  . Dilation and curettage of uterus  2011  . Hemorroidectomy  2004  . Robotic assisted lap vaginal hysterectomy  01/24/2011    Procedure: ROBOTIC ASSISTED LAPAROSCOPIC VAGINAL HYSTERECTOMY;  Surgeon: Agnes Lawrence, MD;  Location: [facility];  Service: Gynecology;  Laterality: N/A;  . Skin surgery      removal basal cell carcinoma   Family History  Problem Relation Age of Onset  . Diabetes Mother   . Hypertension Mother    Social History  Substance Use Topics  . Smoking status: Never Smoker   . Smokeless tobacco: Never Used  . Alcohol Use: Yes     Comment: OCCASSIONAL   OB History    Gravida Para Term Preterm AB TAB SAB Ectopic Multiple Living   1 1 1       1      Review of Systems  Unable to perform ROS: Mental status change      Allergies  Review of patient's allergies indicates no known allergies.  Home Medications   Prior  to Admission medications   Not on File   BP 143/72 mmHg  Pulse 94  Temp(Src) 98 F (36.7 C) (Axillary)  Resp 16  SpO2 100%  LMP 01/09/2011 Physical Exam  Constitutional: She is oriented to person, place, and time. She appears well-developed and well-nourished. No distress.  Guarded and fearful posture. Patient speech is slowed and halted. She is barely able to speak full sentences.   HENT:  Head: Normocephalic and atraumatic.  Eyes: Conjunctivae are normal. No scleral icterus.  Neck: Normal range of motion.  Cardiovascular: Normal rate, regular rhythm and normal heart sounds.  Exam reveals no gallop and no friction rub.   No murmur heard. Pulmonary/Chest: Effort normal and  breath sounds normal. No respiratory distress.  Abdominal: Soft. Bowel sounds are normal. She exhibits no distension and no mass. There is no tenderness. There is no guarding.  Neurological: She is alert and oriented to person, place, and time.  Skin: Skin is warm and dry. She is not diaphoretic.  Psychiatric: Her mood appears anxious. Her speech is delayed and tangential. She is slowed and withdrawn. Thought content is paranoid. She exhibits a depressed mood (FLAT). She is inattentive.  Vitals reviewed.   ED Course  Procedures (including critical care time) Labs Review Labs Reviewed  ACETAMINOPHEN LEVEL - Abnormal; Notable for the following:    Acetaminophen (Tylenol), Serum <10 (*)    All other components within normal limits  URINALYSIS, ROUTINE W REFLEX MICROSCOPIC (NOT AT [facility]) - Abnormal; Notable for the following:    Color, Urine AMBER (*)    APPearance CLOUDY (*)    Bilirubin Urine SMALL (*)    All other components within normal limits  COMPREHENSIVE METABOLIC PANEL  ETHANOL  CBC WITH DIFFERENTIAL/PLATELET  URINE RAPID DRUG SCREEN, HOSP PERFORMED  SALICYLATE LEVEL    Imaging Review No results found. I have personally reviewed and evaluated these images and lab results as part of my medical decision-making.   EKG Interpretation None      MDM   Final diagnoses:  MDD (major depressive disorder), recurrent, severe, with psychosis ([place])    Patient labs without sig. Abnormality.  I suspect a psychiatric cause to her abnormal behavior. Patient will need psychiatric evalation. She appears medically clear.     Margarita Mail, PA-C 02/03/15 [place], MD 02/03/15 [phone]

## 2015-02-02 NOTE — ED Notes (Signed)
Pt. To [facility] from ED ambulatory without difficulty, to room 35 . Report from Autumn RN. Pt. Is alert and oriented, warm and dry in no acute distress. Pt. Denies SI, HI, and AVH. Pt. tearfull but cooperative. Pt. Made aware of security cameras and Q15 minute rounds. Pt. Encouraged to let Nursing staff know of any concerns or needs.

## 2015-02-02 NOTE — ED Notes (Signed)
Pt. Noted in room. Pt. C/o anxiety and insomnia. No acute distress noted. Will continue to monitor with security cameras. Q 15 minute rounds continue.
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
## 2015-02-02 NOTE — BHH Counselor (Addendum)
Called several times at intervals to initiate TTS assessment.  Spoke with nurse Autumn and was told pt in process of being moved to [facility] and assessment had to wait until pt transferred.  Called [facility] and spoke with Shanon Brow and Dominica Severin.  Told TA machine could not be located for further delay.   Faylene Kurtz, MS, CRC, [place] Triage Specialist [facility]

## 2015-02-02 NOTE — ED Notes (Signed)
Pt. Noted in room. No acute distress noted. Will continue to monitor with security cameras. Q 15 minute rounds continue. 

## 2015-02-02 NOTE — ED Notes (Signed)
Husband Emergency contact - [phone] to call for update information.

## 2015-02-02 NOTE — ED Notes (Signed)
Pt here with changes in behavior.  Per family, pt has insomnia with weight loss.  Depression.  Flat affect.  Psych hx in family.  Pt constantly pacing and anxious.  No psych hx.  Pt does not want to leave home.  Pt prefers darkness.

## 2015-02-02 NOTE — ED Notes (Signed)
Pt's family members are going to take belongs home

## 2015-02-03 ENCOUNTER — Inpatient Hospital Stay (HOSPITAL_COMMUNITY)
Admission: AD | Admit: 2015-02-03 | Discharge: 2015-02-09 | DRG: 885 | Disposition: A | Discharge status: Home / Self Care | Source: Intra-hospital | Attending: Psychiatry | Admitting: Psychiatry

## 2015-02-03 DIAGNOSIS — E538 Deficiency of other specified B group vitamins: Secondary | ICD-10-CM | POA: Diagnosis present

## 2015-02-03 DIAGNOSIS — F333 Major depressive disorder, recurrent, severe with psychotic symptoms: Secondary | ICD-10-CM | POA: Diagnosis present

## 2015-02-03 DIAGNOSIS — F329 Major depressive disorder, single episode, unspecified: Secondary | ICD-10-CM | POA: Diagnosis present

## 2015-02-03 DIAGNOSIS — R4789 Other speech disturbances: Secondary | ICD-10-CM

## 2015-02-03 LAB — URINALYSIS, ROUTINE W REFLEX MICROSCOPIC
Glucose, UA: NEGATIVE mg/dL
Hgb urine dipstick: NEGATIVE
KETONES UR: NEGATIVE mg/dL
LEUKOCYTES UA: NEGATIVE
NITRITE: NEGATIVE
Protein, ur: NEGATIVE mg/dL
SPECIFIC GRAVITY, URINE: 1.029 (ref 1.005–1.030)
pH: 5.5 (ref 5.0–8.0)

## 2015-02-03 MED ORDER — LORAZEPAM 1 MG PO TABS
1.0000 mg | ORAL_TABLET | Freq: Three times a day (TID) | ORAL | Status: DC | PRN
  Administered 2015-02-03 – 2015-02-04 (×2): 1 mg via ORAL
  Filled 2015-02-03 (×2): qty 1

## 2015-02-03 MED ORDER — IBUPROFEN 600 MG PO TABS: 600.0000 mg | ORAL_TABLET | Freq: Three times a day (TID) | ORAL | Status: DC | PRN

## 2015-02-03 MED ORDER — ALUM & MAG HYDROXIDE-SIMETH 200-200-20 MG/5ML PO SUSP: 30.0000 mL | ORAL | Status: DC | PRN

## 2015-02-03 MED ORDER — ONDANSETRON HCL 4 MG PO TABS: 4.0000 mg | ORAL_TABLET | Freq: Three times a day (TID) | ORAL | Status: DC | PRN

## 2015-02-03 MED ORDER — ACETAMINOPHEN 325 MG PO TABS: 650.0000 mg | ORAL_TABLET | ORAL | Status: DC | PRN

## 2015-02-03 NOTE — ED Notes (Signed)
tts in with pt 

## 2015-02-03 NOTE — ED Notes (Signed)
Pt. Noted sleeping in room. No complaints or concerns voiced. No distress or abnormal behavior noted. Will continue to monitor with security cameras. Q 15 minute rounds continue. 

## 2015-02-03 NOTE — ED Notes (Signed)
Dr Harrington Challenger and Reginold Agent np in w/ pt

## 2015-02-03 NOTE — BHH Counselor (Signed)
TTS seeking appropriate IP placement as no appropriate beds are currently available at [facility].  The pt's package has been sent to the following hospitals for review:  [place], [place], [facility], [place] Triage Specialist [facility]

## 2015-02-03 NOTE — ED Notes (Signed)
Up to the bathroom 
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
## 2015-02-03 NOTE — ED Notes (Addendum)
Sister in w/pt.  Plan of care discussed with sister with patients permission. Pt and sister are aware that she will be transferred to [facility] for admission today.  Contact information given to sister.

## 2015-02-03 NOTE — ED Notes (Signed)
Ph the phone

## 2015-02-03 NOTE — ED Notes (Signed)
Dr Harrington Challenger and Reginold Agent NP in w/ pt

## 2015-02-03 NOTE — Tx Team (Signed)
Initial Interdisciplinary Treatment Plan   PATIENT STRESSORS: UTA   PATIENT STRENGTHS: Supportive family/friends   PROBLEM LIST: Problem List/Patient Goals Date to be addressed Date deferred Reason deferred Estimated date of resolution  depression 02/03/15   DC  phycosis                                                 DISCHARGE CRITERIA:  Improved stabilization in mood, thinking, and/or behavior  PRELIMINARY DISCHARGE PLAN: Outpatient therapy Return to previous living arrangement  PATIENT/FAMIILY INVOLVEMENT: This treatment plan has been presented to and reviewed with the patient, Alice Adams, and/or family member, *pt.  The patient and family have been given the opportunity to ask questions and make suggestions.  Oretha Milch 02/03/2015, 3:50 PM

## 2015-02-03 NOTE — ED Notes (Signed)
Visitor in w/ pt 

## 2015-02-03 NOTE — BHH Counselor (Signed)
02/03/15 Referral sent to [place], [place], [place], [place], [place], [place], [place], and [place] K. Nash Shearer, LPC-A, [facility]  Counselor 02/03/2015 5:39 AM

## 2015-02-03 NOTE — BH Assessment (Signed)
Writer attempted to call pt's husband. He did not answer. Writer will try again later.   Ruben Reason, MA, LPCA, [facility]

## 2015-02-03 NOTE — Progress Notes (Signed)
[facility] Group Notes:  (Nursing/MHT/Case Management/Adjunct)  Date:  02/03/2015  Time:  11:52 PM  Type of Therapy:  Psychoeducational Skills  Participation Level:  Active  Participation Quality:  Appropriate  Affect:  Depressed  Cognitive:  Appropriate  Insight:  Appropriate  Engagement in Group:  Developing/Improving  Modes of Intervention:  Education  Summary of Progress/Problems: The patient was initially hesitant to speak up in group. The patient mentioned that she was feeling "anxious", but that on a more positive note, she had a good family visit. As a theme for the day, her coping skill will be to use "deep breathing".   Archie Balboa S 02/03/2015, 11:52 PM

## 2015-02-03 NOTE — ED Notes (Signed)
Pt's sister is here to see.

## 2015-02-03 NOTE — BH Assessment (Addendum)
Per Suanne Marker at [facility], [place] has been accepted by Dr. Servando Snare to unit 2-West at [facility] department has been notified. The number to call report is [place], MA, LPCA, [facility]

## 2015-02-03 NOTE — BH Assessment (Signed)
[facility] Assessment Progress Note  Colleen at [facility] called to say tat pt is under review, and may be accepted if they have a  discharge. She requests that we let them know if pt is placed.

## 2015-02-03 NOTE — ED Notes (Signed)
On the phone 

## 2015-02-03 NOTE — ED Notes (Signed)
Pt ambulatory w/o difficulty to [facility] ,  Pt has no belongings

## 2015-02-03 NOTE — ED Notes (Signed)
TTS in talking w/ pt

## 2015-02-03 NOTE — Progress Notes (Addendum)
Patient ID: Alice Adams, female   DOB: [birth year], 54 y.o.   MRN: [id number]   ADMISSION  NOTE  ---   54 year old female admitted in-voluntarily and alone.  Pt. Had displayed bizarre behaviors at home and was brought th ED by husband.   Pt. Is depressed , dis-oriented and confused on admission.  Information was difficult to obtain from the pt. Due to her state of mind.  Per report, she has been paranoid for the past month and increasingly depressed.  No stressors are identified.  Pt. Was given [place] in the ED last night due to tearing at her scrubs C/O bugs crawling on her skin.  Pts. Father suicided  2 years ago.  She lives with husband and has good family support.  This is her first in-pt. Admission.   Pt. Has difficulty forming sentences and organizing her thoughts .   She can not put her thoughts and ideas into words,  Which cause frustration for her as seen with stroke pts.  Pt. Is focused on her home and family and is fearful of being in hospital.   She is friendly and receptive to staff and understands that we are here to help her.  .  Pt. Has no known allergies.   She has chronic back pain from un-known source .    Pt. Denies any HX of any form of substance use or abuse.

## 2015-02-03 NOTE — Progress Notes (Signed)
12:50pm. MD placed pt under IVC. Per Dorann Lodge, paperwork has been approved and law enforcement to be dispatched presently.  [place] Worker [facility] phone: [phone]

## 2015-02-04 ENCOUNTER — Encounter (HOSPITAL_COMMUNITY): Payer: Self-pay | Admitting: Psychiatry

## 2015-02-04 MED ORDER — OLANZAPINE 2.5 MG PO TABS
2.5000 mg | ORAL_TABLET | Freq: Two times a day (BID) | ORAL | Status: DC
  Administered 2015-02-04 – 2015-02-05 (×2): 2.5 mg via ORAL
  Filled 2015-02-04 (×5): qty 1

## 2015-02-04 MED ORDER — TRAZODONE HCL 50 MG PO TABS
50.0000 mg | ORAL_TABLET | Freq: Every evening | ORAL | Status: DC | PRN
  Administered 2015-02-04 – 2015-02-09 (×3): 50 mg via ORAL
  Filled 2015-02-04 (×4): qty 1

## 2015-02-04 MED ORDER — FLUOXETINE HCL 10 MG PO CAPS
10.0000 mg | ORAL_CAPSULE | Freq: Every day | ORAL | Status: DC
  Administered 2015-02-05: 10 mg via ORAL
  Filled 2015-02-04 (×2): qty 1

## 2015-02-04 NOTE — Progress Notes (Signed)
Adult Psychoeducational Group Note  Date:  02/04/2015 Time:  9:00 PM  Group Topic/Focus:  Wrap-Up Group:   The focus of this group is to help patients review their daily goal of treatment and discuss progress on daily workbooks.  Participation Level:  Active  Participation Quality:  Appropriate and Attentive  Affect:  Appropriate  Cognitive:  Appropriate  Insight: Appropriate and Good  Engagement in Group:  Engaged  Modes of Intervention:  Education  Additional Comments:  Pt overall had a good day and enjoyed time in the gym. Pt goal for tomorrow is to talk with the doctors to get a clear understanding of her diagnoses and why she is here.   Alice Adams 02/04/2015, 9:00 PM
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
## 2015-02-04 NOTE — Progress Notes (Signed)
Pt refused to take Zyprexa as prescribed despite much encouragement and education. FNP notified. Pt behavior bizarre, thought blocking. Increased anxiety, paranoia. Will continue to monitor.
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
## 2015-02-04 NOTE — Progress Notes (Signed)
D:Pt denies SI/HI. Denies AVH. Thought blocking noted. Increased anxiety noted.   A:Speical checks q 15 mins in place for safety. Medication administered per MD order (see eMAR). Encouragement and support provided. Pt given education handout on medication per her request.  R:Pt took medication with much encouragement this morning. Safety maintained. Will continue to monitor.

## 2015-02-04 NOTE — Plan of Care (Signed)
Problem: Ineffective individual coping Goal: STG: Patient will remain free from self harm Outcome: Progressing Patient verbally denies self harm or urges to self harm.

## 2015-02-04 NOTE — H&P (Signed)
Psychiatric Admission Assessment Adult  Patient Identification: Alice Adams MRN:  [id number] Date of Evaluation:  02/04/2015 Chief Complaint:  MDD with psychotic features Principal Diagnosis: Major depressive disorder, recurrent episode, severe, with psychosis ([place]) Diagnosis:   Patient Active Problem List   Diagnosis Date Noted  . Major depressive disorder, recurrent episode, severe, with psychosis ([place]) [F33.3] 02/03/2015  . Routine gynecological examination [Z01.419] 01/31/2013   History of Present Illness:PER Admission Note-54 year old female admitted in-voluntarily and alone. Pt. Had displayed bizarre behaviors at home and was brought th ED by husband. Pt. Is depressed , dis-oriented and confused on admission. Information was difficult to obtain from the pt. Due to her state of mind. Per report, she has been paranoid for the past month and increasingly depressed. No stressors are identified. Pt. Was given [place] in the ED last night due to tearing at her scrubs C/O bugs crawling on her skin. Pts. Father suicided 2 years ago. She lives with husband and has good family support. This is her first in-pt. Admission. Pt. Has difficulty forming sentences and organizing her thoughts . She can not put her thoughts and ideas into words, Which cause frustration for her as seen with stroke pts. Pt. Is focused on her home and family and is fearful of being in hospital. She is friendly and receptive to staff and understands that we are here to help her. . Pt. Has no known allergies. She has chronic back pain from un-known source . Pt. Denies any HX of any form of substance use or abuse  On Evaluation:Alice Adams is awake, alert and oriented X4 , found talking to staff at the nursing station.   Denies suicidal or homicidal ideation today. Denies auditory or visual hallucination and does not appear to be responding to internal stimuli. Patient interacts well with staff and  others. Patient reports she is medication compliant without mediation side effects. States her depression 10/10. Patient is tearful and flat. Patient states " feeling very anxious about this hospitalization. " I don't know why my family insisted that I come here." Reports her appetite is improving, states a recent unintentional weight loss. States she is resting well however is unsure if that is due to the nighttime medications. Patient expressed concerns regarding discharge. Support, encouragement and reassurance was provided.   Associated Signs/Symptoms: Depression Symptoms:  depressed mood, insomnia, hopelessness, anxiety, loss of energy/fatigue, weight loss, (Hypo) Manic Symptoms:  Irritable Mood, Anxiety Symptoms:  Excessive Worry, Social Anxiety, Psychotic Symptoms:  Hallucinations: Visual PTSD Symptoms: Avoidance:  Decreased Interest/Participation Total Time spent with patient: 45 minutes  Past Psychiatric History: MDD  Risk to Self: What has been your use of drugs/alcohol within the last 12 months?: pt denies Risk to Others:   Prior Inpatient Therapy:   Prior Outpatient Therapy:    Alcohol Screening: 1. How often do you have a drink containing alcohol?: Never Substance Abuse History in the last 12 months:  No. Consequences of Substance Abuse: NA Previous Psychotropic Medications: Yes  Psychological Evaluations: Yes  Past Medical History:  Past Medical History  Diagnosis Date  . PONV (postoperative nausea and vomiting)   . Elevated cholesterol   . Asthma     NO PROBLEM SINCE 2006 -INVIRONMENTAL - SINCE MOVING FROM NEW [place] TO [place]   . Malaria     TREATED FOR MALARIA 2009  . Anemia   . Skin abrasion     SMALL SCABS ON L LOWER LEG,L THIGH,RT THIGH - UNK CAUSE  .  Thrombocytopenia [facility])     Past Surgical History  Procedure Laterality Date  . Dilation and curettage of uterus  2011  . Hemorroidectomy  2004  . Robotic assisted lap vaginal hysterectomy  01/24/2011     Procedure: ROBOTIC ASSISTED LAPAROSCOPIC VAGINAL HYSTERECTOMY;  Surgeon: Agnes Lawrence, MD;  Location: [facility];  Service: Gynecology;  Laterality: N/A;  . Skin surgery      removal basal cell carcinoma   Family History:  Family History  Problem Relation Age of Onset  . Diabetes Mother   . Hypertension Mother    Family Psychiatric  History: Reports that her father committed suicidal " may have been due to depression" Patients reports she is unsure.  Social History:  History  Alcohol Use  . Yes    Comment: OCCASSIONAL     History  Drug Use No    Social History   Social History  . Marital Status: Married    Spouse Name: N/A  . Number of Children: N/A  . Years of Education: N/A   Social History Main Topics  . Smoking status: Never Smoker   . Smokeless tobacco: Never Used  . Alcohol Use: Yes     Comment: OCCASSIONAL  . Drug Use: No  . Sexual Activity: Yes    Birth Control/ Protection: Condom   Other Topics Concern  . Not on file   Social History Narrative   Additional Social History:    History of alcohol / drug use?: No history of alcohol / drug abuse                    Allergies:  No Known Allergies Lab Results:  Results for orders placed or performed during the hospital encounter of 02/02/15 (from the past 48 hour(s))  Comprehensive metabolic panel     Status: None   Collection Time: 02/02/15  7:05 PM  Result Value Ref Range   Sodium 140 135 - 145 mmol/L   Potassium 4.6 3.5 - 5.1 mmol/L   Chloride 102 101 - 111 mmol/L   CO2 27 22 - 32 mmol/L   Glucose, Bld 94 65 - 99 mg/dL   BUN 14 6 - 20 mg/dL   Creatinine, Ser 0.78 0.44 - 1.00 mg/dL   Calcium 10.1 8.9 - 10.3 mg/dL   Total Protein 7.9 6.5 - 8.1 g/dL   Albumin 4.6 3.5 - 5.0 g/dL   AST 29 15 - 41 U/L   ALT 21 14 - 54 U/L   Alkaline Phosphatase 64 38 - 126 U/L   Total Bilirubin 0.8 0.3 - 1.2 mg/dL   GFR calc non Af Amer >60 >60 mL/min   GFR calc Af Amer >60 >60 mL/min    Comment:  (NOTE) The eGFR has been calculated using the CKD EPI equation. This calculation has not been validated in all clinical situations. eGFR's persistently <60 mL/min signify possible Chronic Kidney Disease.    Anion gap 11 5 - 15  Ethanol     Status: None   Collection Time: 02/02/15  7:05 PM  Result Value Ref Range   Alcohol, Ethyl (B) <5 <5 mg/dL    Comment:        LOWEST DETECTABLE LIMIT FOR SERUM ALCOHOL IS 5 mg/dL FOR MEDICAL PURPOSES ONLY   CBC with Diff     Status: None   Collection Time: 02/02/15  7:05 PM  Result Value Ref Range   WBC 9.7 4.0 - 10.5 K/uL   RBC 4.71 3.87 -  5.11 MIL/uL   Hemoglobin 14.1 12.0 - 15.0 g/dL   HCT 42.4 36.0 - 46.0 %   MCV 90.0 78.0 - 100.0 fL   MCH 29.9 26.0 - 34.0 pg   MCHC 33.3 30.0 - 36.0 g/dL   RDW 12.6 11.5 - 15.5 %   Platelets 327 150 - 400 K/uL   Neutrophils Relative % 68 %   Neutro Abs 6.5 1.7 - 7.7 K/uL   Lymphocytes Relative 21 %   Lymphs Abs 2.1 0.7 - 4.0 K/uL   Monocytes Relative 10 %   Monocytes Absolute 1.0 0.1 - 1.0 K/uL   Eosinophils Relative 1 %   Eosinophils Absolute 0.1 0.0 - 0.7 K/uL   Basophils Relative 0 %   Basophils Absolute 0.0 0.0 - 0.1 K/uL  Salicylate level     Status: None   Collection Time: 02/02/15  7:05 PM  Result Value Ref Range   Salicylate Lvl <3.7 2.8 - 30.0 mg/dL  Acetaminophen level     Status: Abnormal   Collection Time: 02/02/15  7:05 PM  Result Value Ref Range   Acetaminophen (Tylenol), Serum <10 (L) 10 - 30 ug/mL    Comment:        THERAPEUTIC CONCENTRATIONS VARY SIGNIFICANTLY. A RANGE OF 10-30 ug/mL MAY BE AN EFFECTIVE CONCENTRATION FOR MANY PATIENTS. HOWEVER, SOME ARE BEST TREATED AT CONCENTRATIONS OUTSIDE THIS RANGE. ACETAMINOPHEN CONCENTRATIONS >150 ug/mL AT 4 HOURS AFTER INGESTION AND >50 ug/mL AT 12 HOURS AFTER INGESTION ARE OFTEN ASSOCIATED WITH TOXIC REACTIONS.   Urine rapid drug screen (hosp performed)not at [facility]     Status: None   Collection Time: 02/02/15  7:53 PM  Result  Value Ref Range   Opiates NONE DETECTED NONE DETECTED   Cocaine NONE DETECTED NONE DETECTED   Benzodiazepines NONE DETECTED NONE DETECTED   Amphetamines NONE DETECTED NONE DETECTED   Tetrahydrocannabinol NONE DETECTED NONE DETECTED   Barbiturates NONE DETECTED NONE DETECTED    Comment:        DRUG SCREEN FOR MEDICAL PURPOSES ONLY.  IF CONFIRMATION IS NEEDED FOR ANY PURPOSE, NOTIFY LAB WITHIN 5 DAYS.        LOWEST DETECTABLE LIMITS FOR URINE DRUG SCREEN Drug Class       Cutoff (ng/mL) Amphetamine      1000 Barbiturate      200 Benzodiazepine   169 Tricyclics       678 Opiates          300 Cocaine          300 THC              50   Urinalysis, Routine w reflex microscopic (not at [facility])     Status: Abnormal   Collection Time: 02/03/15 11:50 AM  Result Value Ref Range   Color, Urine AMBER (A) YELLOW    Comment: BIOCHEMICALS MAY BE AFFECTED BY COLOR   APPearance CLOUDY (A) CLEAR   Specific Gravity, Urine 1.029 1.005 - 1.030   pH 5.5 5.0 - 8.0   Glucose, UA NEGATIVE NEGATIVE mg/dL   Hgb urine dipstick NEGATIVE NEGATIVE   Bilirubin Urine SMALL (A) NEGATIVE   Ketones, ur NEGATIVE NEGATIVE mg/dL   Protein, ur NEGATIVE NEGATIVE mg/dL   Nitrite NEGATIVE NEGATIVE   Leukocytes, UA NEGATIVE NEGATIVE    Comment: MICROSCOPIC NOT DONE ON URINES WITH NEGATIVE PROTEIN, BLOOD, LEUKOCYTES, NITRITE, OR GLUCOSE <1000 mg/dL.    Metabolic Disorder Labs:  No results found for: HGBA1C, MPG No results found for: PROLACTIN Lab Results  Component Value Date   CHOL 283* 01/31/2013   TRIG 212* 01/31/2013   HDL 54 01/31/2013   LDLCALC 187* 01/31/2013    Current Medications: Current Facility-Administered Medications  Medication Dose Route Frequency Provider Last Rate Last Dose  . acetaminophen (TYLENOL) tablet 650 mg  650 mg Oral Q4H PRN Delfin Gant, NP      . alum & mag hydroxide-simeth (MAALOX/MYLANTA) 200-200-20 MG/5ML suspension 30 mL  30 mL Oral PRN Delfin Gant, NP      .  Derrill Memo ON 02/05/2015] FLUoxetine (PROZAC) capsule 10 mg  10 mg Oral Daily Derrill Center, NP      . ibuprofen (ADVIL,MOTRIN) tablet 600 mg  600 mg Oral Q8H PRN Delfin Gant, NP      . LORazepam (ATIVAN) tablet 1 mg  1 mg Oral Q8H PRN Delfin Gant, NP   1 mg at 02/04/15 0759  . ondansetron (ZOFRAN) tablet 4 mg  4 mg Oral Q8H PRN Delfin Gant, NP       PTA Medications: No prescriptions prior to admission    Musculoskeletal: Strength & Muscle Tone: within normal limits Gait & Station: normal Patient leans: N/A  Psychiatric Specialty Exam: Physical Exam  Nursing note and vitals reviewed. Constitutional: She is oriented to person, place, and time. She appears well-developed and well-nourished.  HENT:  Head: Normocephalic.  Musculoskeletal: Normal range of motion.  Neurological: She is alert and oriented to person, place, and time.  Skin: Skin is warm and dry.  Psychiatric: She has a normal mood and affect. Her behavior is normal.    Review of Systems  Psychiatric/Behavioral: Positive for depression and hallucinations. Negative for suicidal ideas. The patient is nervous/anxious.   All other systems reviewed and are negative.   Blood pressure 109/68, pulse 80, temperature 97.5 F (36.4 C), temperature source Oral, resp. rate 16, height 5' 4.57" (1.64 m), weight 52.617 kg (116 lb), last menstrual period 01/09/2011, SpO2 100 %.Body mass index is 19.56 kg/(m^2).  General Appearance: Casual  Eye Contact::  Good  Speech:  Clear and Coherent and Slow  Volume:  Normal  Mood:  Anxious and Depressed  Affect:  Depressed and Tearful  Thought Process:  Coherent, Analytical with slow deliberate response  Orientation:  Full (Time, Place, and Person)  Thought Content:  Rumination and  thoughts of worrie   Suicidal Thoughts:  No  Homicidal Thoughts:  No  Memory:  Immediate;   Fair Recent;   Fair Remote;   Fair  Judgement:  Intact  Insight:  Lacking  Psychomotor Activity:   Restlessness  Concentration:  Poor  Recall:  [employer] of Knowledge:Fair  Language: Fair  Akathisia:  No  Handed:  Right  AIMS (if indicated):     Assets:  Communication Skills Desire for Improvement  ADL's:  Intact  Cognition: WNL  Sleep:  Number of Hours: 6     Treatment Plan Summary: Daily contact with patient to assess and evaluate symptoms and progress in treatment and Medication management Start Prozac 10 mg for depression/anxiety mood stabilization  Start Trazodone 50 mg PRN for insomnia EKG ordered before starting Fluoxetine 54m - Pending Continue Ativan 1 mg for Anxiety/agitation  Will continue to monitor vitals ,medication compliance and treatment side effects while patient is here.  Reviewed labs Glucose WNL elevated ,BAL -, UDS - CSW will start working on disposition.  Patient to participate in therapeutic milieu   Observation Level/Precautions:  15 minute checks  Laboratory:  CBC  Chemistry Profile UDS UA UDS-, CMP, CBC WNL  Psychotherapy:  Individual and group session  Medications:  Fluoxetine  Consultations:  Psychiatry  Discharge Concerns:  Safety, stabilization, and risk of access to medication and medication stabilization   Estimated LOS: 5-7 days  Other:     I certify that inpatient services furnished can reasonably be expected to improve the patient's condition.   Derrill Center FNP -BC 11/27/201612:50 PM I personally assessed the patient, reviewed the physical exam and labs and formulated the treatment plan Geralyn Flash A. Sabra Heck, M.D.

## 2015-02-04 NOTE — Progress Notes (Signed)
EKG completed and placed on chart per MD order.

## 2015-02-04 NOTE — Progress Notes (Signed)
D: Patient alert and oriented x 4. Patient denies pain SI/HI/AVH.  A: Staff to monitor Q 15 mins for safety. Encouragement and support offered. Scheduled medications administered per orders. R: Patient remains safe on the unit. Patient attended group tonight. Patient visible on hte unit and interacting with peers. Patient taking administered medications.  

## 2015-02-04 NOTE — BHH Group Notes (Signed)
[place] Group Notes:  (Clinical Social Work)  02/04/2015  [place] Group Notes:  (Clinical Social Work)  02/04/2015  11:00AM-12:00PM  Summary of Progress/Problems:  The main focus of today's process group was to listen to a variety of genres of music and to identify that different types of music provoke different responses.  The patient then was able to identify personally what was soothing for them, as well as energizing.  Handouts were used to record feelings evoked, as well as how patient can personally use this knowledge in sleep habits, with depression, and with other symptoms.  The patient expressed understanding of concepts, basic only.  She said she was somewhat confused, was trhying to "catch up and not feel so unfocused."  She stayed throughout group without changing seats, however.  Type of Therapy:  Music Therapy   Participation Level:  Active  Participation Quality:  Attentive and Sharing  Affect:  Blunted  Cognitive:  Confused  Insight:  Improving  Engagement in Therapy:  Improving  Modes of Intervention:   Activity, Exploration  Selmer Dominion, LCSW 02/04/2015

## 2015-02-05 DIAGNOSIS — F333 Major depressive disorder, recurrent, severe with psychotic symptoms: Principal | ICD-10-CM

## 2015-02-05 LAB — TSH: TSH: 1.888 u[IU]/mL (ref 0.350–4.500)

## 2015-02-05 MED ORDER — FLUOXETINE HCL 20 MG PO CAPS
20.0000 mg | ORAL_CAPSULE | Freq: Every day | ORAL | Status: DC
  Administered 2015-02-06: 20 mg via ORAL
  Filled 2015-02-05 (×3): qty 1

## 2015-02-05 MED ORDER — OLANZAPINE 5 MG PO TABS
5.0000 mg | ORAL_TABLET | Freq: Every day | ORAL | Status: DC
  Administered 2015-02-05: 5 mg via ORAL
  Filled 2015-02-05 (×3): qty 1
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
## 2015-02-05 NOTE — Progress Notes (Signed)
Patient lacks capacity to participate in disposition planning.  Breindy Meadow ,MD Attending Psychiatrist  [facility]  

## 2015-02-05 NOTE — Progress Notes (Signed)
D: Pt denies SI/HI/AVH. Pt is pleasant and cooperative. Pt took her Zyprexa that she refused earlier. Pt appears very paranoid . Pt stated she has been over-thinking things in her life. Pt appears a little disorganized at times.   A: Pt was offered support and encouragement. Pt was given scheduled medications. Pt was encourage to attend groups. Q 15 minute checks were done for safety.   R:Pt attends groups and interacts well with peers and staff. Pt is taking medication. Pt has no complaints at this time .Pt receptive to treatment and safety maintained on unit.

## 2015-02-05 NOTE — Progress Notes (Signed)
DAR NOTE: Patient appears fearful with thoughts blocking during assessment.  Patient paranoid and reluctant in taking her medications.  Explained to patient the medication indications and dosage amount.  Denies pain, auditory and visual hallucinations.  Rates depression at 0, hopelessness at 0, and anxiety at 0.  Maintained on routine safety checks.  Medications given as prescribed.  Support and encouragement offered as needed.  States goal for today is "participate and communicate how you feel."  Patient educated on the importance of medication compliance.  Patient remained isolative and withdrawn.

## 2015-02-05 NOTE — BHH Group Notes (Signed)
[facility] LCSW Aftercare Discharge Planning Group Note   02/05/2015 10:46 AM  Participation Quality:  Minimal  Mood/Affect:  Anxious  Depression Rating:    Anxiety Rating:    Thoughts of Suicide:  No Will you contract for safety?   NA  Current AVH:  No  Plan for Discharge/Comments:  Thought blocking or anxious, or both.  Unable to answer questions in a reasonable time frame.  Frustrated that she is unable to do so.  Transportation Means:   Supports:  Roque Lias B

## 2015-02-05 NOTE — BHH Group Notes (Signed)
[facility] Group Notes:  (Counselor/Nursing/MHT/Case Management/Adjunct)  02/05/2015 1:15PM  Type of Therapy:  Group Therapy  Participation Level:  Active  Participation Quality:  Appropriate  Affect:  Flat  Cognitive:  Oriented  Insight:  Improving  Engagement in Group:  Limited  Engagement in Therapy:  Limited  Modes of Intervention:  Discussion, Exploration and Socialization  Summary of Progress/Problems: The topic for group was balance in life.  Pt participated in the discussion about when their life was in balance and out of balance and how this feels.  Pt discussed ways to get back in balance and short term goals they can work on to get where they want to be. Was hovering outside of the room for quite awhile, trying to decide whether to come in or stay out.  Eventually came in.  Sat quietly and did not participate.   Roque Lias B 02/05/2015 3:21 PM

## 2015-02-05 NOTE — Tx Team (Addendum)
Interdisciplinary Treatment Plan Update (Adult)  Date:  02/05/2015   Time Reviewed:  8:56 AM   Progress in Treatment: Attending groups: Yes. Participating in groups:  Yes. Taking medication as prescribed:  Yes. Tolerating medication:  Yes. Family/Significant other contact made:  No Patient understands diagnosis:  Yes  As evidenced by seeking help with depression, anxiety Discussing patient identified problems/goals with staff:  Yes, see initial care plan. Medical problems stabilized or resolved:  Yes. Denies suicidal/homicidal ideation: Yes. Issues/concerns per patient self-inventory:  No. Other:  New problem(s) identified:  Discharge Plan or Barriers:  See below  Reason for Continuation of Hospitalization: Anxiety Depression Medication stabilization  Comments:  Pt here with changes in behavior. Per family, pt has insomnia with weight loss. Depression. Flat affect. Psych hx in family. Pt constantly pacing and anxious. No psych hx. Pt does not want to leave home. Pt prefers darkness.   Prozac, Zyprexa trial  Estimated length of stay: 4-5 days  New goal(s):  Review of initial/current patient goals per problem list:   Review of initial/current patient goals per problem list:  1. Goal(s): Patient will participate in aftercare plan   Met: Yes   Target date: 3-5 days post admission date   As evidenced by: Patient will participate within aftercare plan AEB aftercare provider and housing plan at discharge being identified.  02/05/15:  Return home, follow up [facility]   2. Goal (s): Patient will exhibit decreased depressive symptoms and suicidal ideations.   Met: No   Target date: 3-5 days post admission date   As evidenced by: Patient will utilize self rating of depression at 3 or below and demonstrate decreased signs of depression or be deemed stable for discharge by MD. 02/05/15:  Rates her depression a 10.     3. Goal(s): Patient will  demonstrate decreased signs and symptoms of anxiety.   Met: No   Target date: 3-5 days post admission date   As evidenced by: Patient will utilize self rating of anxiety at 3 or below and demonstrated decreased signs of anxiety, or be deemed stable for discharge by MD 02/05/15:  Although unable to rate her anxiety, pt presents as highly anxious.  Pacing, focused on her inability to communicate and worried and frustrated about this.               Attendees: Patient:  02/05/2015 8:56 AM   Family:   02/05/2015 8:56 AM   Physician:  Ursula Alert, MD 02/05/2015 8:56 AM   Nursing:   Hedy Jacob, RN 02/05/2015 8:56 AM   CSW:    Roque Lias, LCSW   02/05/2015 8:56 AM   Other:  02/05/2015 8:56 AM   Other:   02/05/2015 8:56 AM   Other:  Lars Pinks, Nurse CM 02/05/2015 8:56 AM   Other:   02/05/2015 8:56 AM   Other:  Norberto Sorenson, [place]  02/05/2015 8:56 AM   Other:  02/05/2015 8:56 AM   Other:  02/05/2015 8:56 AM   Other:  02/05/2015 8:56 AM   Other:  02/05/2015 8:56 AM   Other:  02/05/2015 8:56 AM   Other:   02/05/2015 8:56 AM    Scribe for Treatment Team:   Trish Mage, 02/05/2015 8:56 AM

## 2015-02-05 NOTE — Plan of Care (Signed)
Problem: Alteration in mood Goal: LTG-Patient reports reduction in suicidal thoughts (Patient reports reduction in suicidal thoughts and is able to verbalize a safety plan for whenever patient is feeling suicidal)  Outcome: Progressing Pt denies SI at this time     

## 2015-02-06 ENCOUNTER — Inpatient Hospital Stay (HOSPITAL_COMMUNITY)
Admission: AD | Admit: 2015-02-06 | Discharge: 2015-02-06 | Disposition: A | Discharge status: Home / Self Care | Source: Intra-hospital | Attending: Psychiatry | Admitting: Psychiatry

## 2015-02-06 LAB — LIPID PANEL
Cholesterol: 194 mg/dL (ref 0–200)
HDL: 47 mg/dL (ref >40)
LDL CALC: 125 mg/dL — AB (ref 0–99)
Total CHOL/HDL Ratio: 4.1 RATIO
Triglycerides: 112 mg/dL (ref <150)
VLDL: 22 mg/dL (ref 0–40)

## 2015-02-06 LAB — RPR: RPR: NONREACTIVE

## 2015-02-06 LAB — HEMOGLOBIN A1C
HEMOGLOBIN A1C: 6.2 % — AB (ref 4.8–5.6)
Mean Plasma Glucose: 131 mg/dL

## 2015-02-06 LAB — VITAMIN B12: VITAMIN B 12: 265 pg/mL (ref 180–914)

## 2015-02-06 LAB — FOLATE: Folate: 14.9 ng/mL (ref >5.9)

## 2015-02-06 MED ORDER — CYANOCOBALAMIN 500 MCG PO TABS
500.0000 ug | ORAL_TABLET | Freq: Every day | ORAL | Status: DC
  Administered 2015-02-06 – 2015-02-09 (×4): 500 ug via ORAL
  Filled 2015-02-06 (×6): qty 1

## 2015-02-06 MED ORDER — FLUOXETINE HCL 10 MG PO CAPS
30.0000 mg | ORAL_CAPSULE | Freq: Every day | ORAL | Status: DC
  Administered 2015-02-07 – 2015-02-09 (×3): 30 mg via ORAL
  Filled 2015-02-06 (×5): qty 3

## 2015-02-06 MED ORDER — OLANZAPINE 7.5 MG PO TABS
7.5000 mg | ORAL_TABLET | Freq: Every day | ORAL | Status: DC
  Administered 2015-02-06 – 2015-02-08 (×3): 7.5 mg via ORAL
  Filled 2015-02-06 (×5): qty 1
  Filled 2015-02-06: qty 3

## 2015-02-06 MED ORDER — MENTHOL 3 MG MT LOZG
1.0000 | LOZENGE | OROMUCOSAL | Status: DC | PRN
  Administered 2015-02-06: 3 mg via ORAL

## 2015-02-06 MED ORDER — BIOTENE DRY MOUTH MT LIQD
15.0000 mL | OROMUCOSAL | Status: DC | PRN
  Filled 2015-02-06: qty 237

## 2015-02-06 NOTE — Progress Notes (Signed)
D: Pt denies SI/HI/AVH. Pt is pleasant and cooperative. Pt continues to present with thought blocking. Pt stated she wanted to get her focus back. Pt stated she was " fighting the fog". Pt believes she is better.   A: Pt was offered support and encouragement. Pt was given scheduled medications. Pt was encourage to attend groups. Q 15 minute checks were done for safety.   R:Pt attends groups and interacts well with peers and staff. Pt is taking medication. Pt has no complaints at this time .Pt receptive to treatment and safety maintained on unit.

## 2015-02-06 NOTE — Progress Notes (Signed)
Spoke with CT. CT head scheduled for today at 5.

## 2015-02-06 NOTE — BHH Group Notes (Signed)
[facility] LCSW Group Therapy  02/06/2015 12:26 PM   Type of Therapy:  Group Therapy  Participation Level:  Active  Participation Quality:  Attentive  Affect:  Appropriate  Cognitive:  Appropriate  Insight:  Improving  Engagement in Therapy:  Engaged  Modes of Intervention:  Clarification, Education, Exploration and Socialization  Summary of Progress/Problems: Today's group focused on relapse prevention.  We defined the term, and then brainstormed on ways to prevent relapse.  Lurking in the hallway before coming in.  Eventually made her way in, sat quietly.  At the end, I asked her what color my shirt is.  She immediately answered correctly, and I gave her positive feedback for answering without hesitation, and for her participation.  Roque Lias B 02/06/2015 , 12:26 PM
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
## 2015-02-06 NOTE — Progress Notes (Signed)
Adult Psychoeducational Group Note  Date:  02/06/2015 Time:  8:49 PM  Group Topic/Focus:  Wrap-Up Group:   The focus of this group is to help patients review their daily goal of treatment and discuss progress on daily workbooks.  Participation Level:  Active  Participation Quality:  Appropriate  Affect:  Appropriate  Cognitive:  Appropriate  Insight: Appropriate  Engagement in Group:  Engaged  Modes of Intervention:  Discussion  Additional Comments: The patient expressed that she attended group.The patient also said that she rates her day a 5.  Nash Shearer 02/06/2015, 8:49 PM

## 2015-02-06 NOTE — Progress Notes (Signed)
Pt back on the unit from getting CT. Will continue to monitor.

## 2015-02-06 NOTE — Plan of Care (Signed)
Problem: Ineffective individual coping Goal: STG: Patient will remain free from self harm Outcome: Progressing Pt safe on the unit at this time     

## 2015-02-06 NOTE — [facility] Group Notes (Signed)
[place] Group Notes:  (Nursing/MHT/Case Management/Adjunct)  Date:  02/06/2015  Time:  11:11 AM  Type of Therapy:  Nurse Education  Participation Level:  Did Not Attend  Participation Quality:    Affect:    Cognitive:    Insight:    Engagement in Group:    Modes of Intervention:  Discussion and Education  Summary of Progress/Problems:  Group discussion was sleep hygiene, importance in communication with medical providers and positive support systems.  Alice Adams did not attend group.  Alice Adams 02/06/2015, 11:11 AM

## 2015-02-06 NOTE — Progress Notes (Signed)
Recreation Therapy Notes  11.29.2016 @ approximately 1430 per MD order LRT met with patient to establish therapeutic rapport. Patient presented with flat affect and experienced significant thought blocking. Despite thought blocking patient attempted to communicate with LRT and was willing to engage with her. Patient was unable to answer LRT questions or was significantly delayed in answering. Patient anxiety increased during interaction, as she began to fidget. Patient observed to have significant dry mouth during interaction, 1:1 interaction ended with LRT assisting patient with getting water from RN.   Laureen Ochs Alice Adams, LRT/CTRS    Miryah Ralls L 02/06/2015 3:28 PM

## 2015-02-06 NOTE — Progress Notes (Signed)
Called Radiology to schedule CT head. No answer. Will try again

## 2015-02-06 NOTE — Progress Notes (Signed)
D: Pt denies SI/HI. Paranoid. Pt slow to respond on approach. Thought blocking.Pt denies AVH. Pt attending groups.  A:Special checks q 15 mins in place for safety. Medication administered per MD order (See eMAR) Encouragement and support provided.  R:Safety maintained. Took medication with much encouragement. Will continue to monitor.

## 2015-02-06 NOTE — Progress Notes (Signed)
Spoke with charge nurse Patrice RN and Emeline [facility] verified. OK for pt to travel via [facility] to [facility] for CT of head. Pt out of facility with much encouragement.

## 2015-02-07 LAB — PROLACTIN: PROLACTIN: 47.5 ng/mL — AB (ref 4.8–23.3)

## 2015-02-07 NOTE — [facility] Group Notes (Signed)
[facility] LCSW Group Therapy  02/07/2015 2:05 PM  Type of Therapy: Group Therapy  Participation Level: Active  Participation Quality: Attentive  Affect: Flat  Cognitive: Oriented  Insight: Limited  Engagement in Therapy: Engaged  Modes of Intervention: Discussion and Socialization  Summary of Progress/Problems: Shanon Brow from the [place] was here to tell his story of recovery and play his guitar. Entered room very hesitantly and slowly. Eventually came in and sat down and stayed for the entire group. Alert and oriented throughout the group.  Kara Mead. Marshell Levan 02/07/2015 2:05 PM

## 2015-02-07 NOTE — Progress Notes (Signed)
Adult Psychoeducational Group Note  Date:  02/07/2015 Time:  8:40 PM  Group Topic/Focus:  Wrap-Up Group:   The focus of this group is to help patients review their daily goal of treatment and discuss progress on daily workbooks.  Participation Level:  Minimal  Participation Quality:  Appropriate  Affect:  Appropriate  Cognitive:  Appropriate  Insight: Appropriate  Engagement in Group:  Engaged  Modes of Intervention:  Discussion  Additional Comments:  Pt attended group with minimal participation. Pt stated one positive thing that happened today was that she went outside and she wants to work on her family while she is here.  Clint Bolder 02/07/2015, 8:40 PM
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
## 2015-02-07 NOTE — Progress Notes (Signed)
Patient ID: Alice Adams, female   DOB: 06/05/1960, 54 y.o.   MRN: [id number] D   ---   Pt. Denies pain and agrees to contract for safety.   She maintains a depressed , anxious, worried affect and paces the hall talking to herself.   She is focused on going home " to take care of things".   She appears moderately confused and dis-oriented .  She does not understand why she is in this hospital or why she can not go home.  She has no interaction with peers and seldom goes into the day room.  She holds her hospital pts. [employer] of rights in her hand and will walk and stop , read, then walk more . Repeating this ritual over and over.   Pt. Went outside with peers at 39 and the fresh air had a positive effect.  Pt. Shows no negative behaviors.  --- A ---  Support and encouragement provided.  --- R --  Pt. Remains safe but confused on unit

## 2015-02-07 NOTE — Progress Notes (Signed)
   D: Writer approached the pt, introduced herself and pt if she had time to speak to the Probation officer. Pt followed the writer almost instantly. However, when asked if she had any questions or concerns that hadn't been addressed, pt stated, "I have one...." After several seconds pt stated, "Seems like I just...." After several minutes pt asked the writer for burgundy scrubs. Stated, "When is the right time to ask for them", referring to the scrubs. Writer explained normal procedure for scrubs. Pt then stated, "when is it the right time to change your linen.?" Informed pt that she could change her linen whenever she felt it was needed. Pt has no other questions or concerns.   A: Pt was given clean linen and a set of scrubs. Pt bagged her clothes and sent them  the laundry. 15 min checks continued for safety. Support and encouragement was offered for safety.  R: Pt remains safe.

## 2015-02-07 NOTE — Progress Notes (Signed)
Recreation Therapy Notes  11.30.2016 @ approximately 1130. LRT encountered patient as she was going outside for general recreation time. LRT requested to walk with patient, patient non-verbally agreed by nodding her head yes. Upon entering courtyard patient appeared overwhelmed, as she stopped and took approximately a 30 second delay before traversing the steps. Patient and LRT walked around courtyard with LRT asking benign questions, such as patient favorite food and her favorite restaurant. Patient demonstrated thought blocking as she was delayed in responding or was not able to respond to LRT. After walking around the courtyard for approximately 10 minutes patient indicated she wanted to sit on a bench by walking towards it. Upon sitting down patient thought process slowly cleared and she was able to engage in minimal conversation with LRT. Patient commented on the weather and spontaneously asked LRT questions about herself. Patient shared during this time she has enjoyed painting in the past and reading, as well as playing basketball and participating in zumba classes. Patient spoke softly and would often pause with her eyes closed to ensure she spoke in coherent sentences. Patient additionally commented she was glad she decided to leave the unit, as she would have felt stuck if she had stayed on the unit.   Laureen Ochs Laurey Salser, LRT/CTRS   Lane Hacker 02/07/2015 8:48 PM

## 2015-02-07 NOTE — BHH Group Notes (Signed)
[facility] LCSW Aftercare Discharge Planning Group Note   02/07/2015 11:04 AM  Participation Quality:  Active  Mood/Affect:  Flat  Depression Rating:    Anxiety Rating:    Thoughts of Suicide:  No Will you contract for safety?   NA  Current AVH:  Yes  Plan for Discharge/Comments: Pt presented as very flat and was slow to process questions asked of her. When asked what was on her mind pt paused for a while and said "Going home like everyone else". CSW will speak with pt's husband later today.  Transportation Means:   Supports:  Georga Kaufmann
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
## 2015-02-08 NOTE — Progress Notes (Signed)
D:  Pt was more lucid and spontaneous today than previous day. Informed the writer that she's "thinking about all the things I have to do", referring to activities after discharge. Pt informed the writer that she wasn't ready for her night meds because her food had digested. Pt has no other questions or concerns.   A:  Support and encouragement was offered. 15 min checks continued for safety.  R: Pt remains safe.

## 2015-02-08 NOTE — Progress Notes (Signed)
Recreation Therapy Notes  12.01.2016 @ approximately 0830 LRT met with patient to offer drawing activity as patient expressed interest in painting during previous 1:1. Patient expressed no desire to draw with LRT, but did express interest in pacing the hall with LRT. During time LRT and patient spent walking up and down 500 hall patient spontaneously volunteered that her husband is a ready to have her return home and that he encourages her to participate in painting. Patient shared she has a sketch book at home and he encourages her to use it. Her husband additionally encourages her to read. Patient expressed she has decided that she will take up painting again when she returns home. Patient additionally shared that her family "wants me back." She stated that numerous family members have told her they want her to return to the person she was. Patient began to show signs of dry mouth and expressed she was having difficulty due to dry mouth. LRT notified RN, who provided patient with water and mouthwash to help with dry mouth. Patient receptive and appreciative.   Laureen Ochs Travaughn Vue, LRT/CTRS  Lane Hacker 02/08/2015 9:14 PM

## 2015-02-08 NOTE — Progress Notes (Signed)
D: Alice Adams rates anxiety 2/10. She states she is very concerned about whether or not she is being discharged tomorrow. She states she is ready to go home. She has been observed to walk up and down the hallway almost in a  Wandering like manner. She peers in dayroom and then turns back around. She did attend karaoke group tonight off the unit but was very hesitant. During our conversation she looks around from left to right repeatedly as if someone is beside her or behind her. She has moderate thought blocking with possible internal stimuli response. She was confused about her medications and which ones she is currently being prescribed. She was hesitant when given her meds and stared at them repeatedly before taking them. She denies SI/HI/AVH.  A: Encouragement and support given.  R: Continue to monitor for patient safety and medication effectiveness.

## 2015-02-08 NOTE — Tx Team (Signed)
Date: 02/05/2015  Time Reviewed: 8:56 AM   Progress in Treatment: Attending groups: Yes. Participating in groups: Yes. Taking medication as prescribed: Yes. Tolerating medication: Yes. Family/Significant other contact made: No Patient understands diagnosis: Yes As evidenced by seeking help with depression, anxiety Discussing patient identified problems/goals with staff: Yes, see initial care plan. Medical problems stabilized or resolved: Yes. Denies suicidal/homicidal ideation: Yes. Issues/concerns per patient self-inventory: No. Other:  New problem(s) identified:  Discharge Plan or Barriers: See below  Reason for Continuation of Hospitalization: Anxiety Depression Medication stabilization  Comments: Pt here with changes in behavior. Per family, pt has insomnia with weight loss. Depression. Flat affect. Psych hx in family. Pt constantly pacing and anxious. No psych hx. Pt does not want to leave home. Pt prefers darkness. Prozac, Zyprexa trial  02/07/15:  Pt was more lucid and spontaneous today than previous day. Informed the writer that she's "thinking about all the things I have to do", referring to activities after discharge. Pt informed the writer that she wasn't ready for her night meds because her food had digested. Pt has no other questions or concerns.   A: Support and encouragement was offered. 15 min checks continued for safety.  R: Pt remains safe.           Estimated length of stay: Pt will likely discharge tomorrow.  New goal(s):  Review of initial/current patient goals per problem list:  Review of initial/current patient goals per problem list:  1. Goal(s): Patient will participate in aftercare plan   Met: Yes   Target date: 3-5 days post admission date   As evidenced by: Patient will participate within aftercare plan AEB aftercare provider and housing plan at discharge being identified.  02/05/15: Return home,  [facility]   2. Goal (s): Patient will exhibit decreased depressive symptoms and suicidal ideations.   Met: Yes   Target date: 3-5 days post admission date   As evidenced by: Patient will utilize self rating of depression at 3 or below and demonstrate decreased signs of depression or be deemed stable for discharge by MD. 02/05/15: Rates her depression a 10. 02/08/15: Pt still presents with a flat affect but husband is comfortable with pt returning home. Will continue to be monitored by outpt provider.    3. Goal(s): Patient will demonstrate decreased signs and symptoms of anxiety.   Met: Yes   Target date: 3-5 days post admission date   As evidenced by: Patient will utilize self rating of anxiety at 3 or below and demonstrated decreased signs of anxiety, or be deemed stable for discharge by MD 02/05/15: Although unable to rate her anxiety, pt presents as highly anxious. Pacing, focused on her inability to communicate and worried and frustrated about this. 02/08/15: Husband is comfortable with pt returning home. Pt will continue to be monitored by outpt provider.               Attendees: Patient:  02/05/2015 8:56 AM   Family:  02/05/2015 8:56 AM   Physician: Ursula Alert, MD 02/05/2015 8:56 AM   Nursing: Hedy Jacob, RN 02/05/2015 8:56 AM   CSW: Roque Lias, LCSW  02/05/2015 8:56 AM   Other:  02/05/2015 8:56 AM   Other:  02/05/2015 8:56 AM   Other: Lars Pinks, Nurse CM 02/05/2015 8:56 AM   Other:  02/05/2015 8:56 AM   Other: Norberto Sorenson, [place]  02/05/2015 8:56 AM   Other:  02/05/2015 8:56 AM   Other:  02/05/2015 8:56 AM  Other:  02/05/2015 8:56 AM   Other:  02/05/2015 8:56 AM   Other:  02/05/2015 8:56 AM   Other:  02/05/2015 8:56 AM    Scribe for Treatment Team:  Trish Mage, 02/05/2015 8:56 AM

## 2015-02-08 NOTE — Progress Notes (Signed)
Patient has left off of the unit and attend evening meal.  Patient was able to eat meal without incident off of the unit.  Patient continues to maintain safety.

## 2015-02-08 NOTE — BHH Group Notes (Signed)
[place] LCSW Group Therapy  02/08/2015 3:38 PM  Type of Therapy: Process Group Therapy  Participation Level: Active  Participation Quality: Appropriate  Affect: Flat  Cognitive: Oriented  Insight: Limited  Engagement in Group: Engaged  Engagement in Therapy: Limited  Modes of Intervention: Activity, Clarification, Education, Problem-solving and Support  Summary of Progress/Problems: Today's group addressed the issue of overcoming obstacles. Patients were asked to identify their biggest obstacle post d/c that stands in the way of their on-going success, and then problem solve as to how to manage this. "I will be own biggest obstacle because I pride myself in being independent, and I have been stubborn about accepting help from others." Talked more than she has in any other group. Feels regrets about decisions she has made. Talked about her struggles with environment and feeling overwhelmed with things she has no control over.  Was open to feedback from others, and agreed she is still adjusting to staying occupied post retirement.   Kara Mead. Marshell Levan, MSW Intern 02/08/2015 3:38 PM

## 2015-02-08 NOTE — Progress Notes (Signed)
Patient has been on the unit pacing.  Patient is often noted to be mumbling to herself and talking to unseen other. Patient stated "I don't know how many babies I have in my stomach."  Patient whisper in a low tone when she speaks and is often seen giggling.  Patient has had no incidents of behavioral dyscontrol.

## 2015-02-08 NOTE — Progress Notes (Signed)
D-  Patient was noted to be pacing the halls, attending groups, and engaged in conversation with staff and peers. Patient still moves slowly and and talks with a slowed speech.  Patient denies SI, HI and AVH but says that her anxiety is elevated 8/10.    A-  Offer patient medications, assess patient for safety, assess for side effects of medications, encourage patient attend groups.  R-  Patient was able to maintain safety this shift.

## 2015-02-09 MED ORDER — FLUOXETINE HCL 10 MG PO CAPS: 30.0000 mg | ORAL_CAPSULE | Freq: Every day | ORAL | Status: AC

## 2015-02-09 MED ORDER — CYANOCOBALAMIN 500 MCG PO TABS: 500.0000 ug | ORAL_TABLET | Freq: Every day | ORAL | Status: AC

## 2015-02-09 MED ORDER — OLANZAPINE 7.5 MG PO TABS: 7.5000 mg | ORAL_TABLET | Freq: Every day | ORAL | Status: AC

## 2015-02-09 MED ORDER — TRAZODONE HCL 50 MG PO TABS: 50.0000 mg | ORAL_TABLET | Freq: Every evening | ORAL | Status: AC | PRN

## 2015-02-09 NOTE — Progress Notes (Signed)
Recreation Therapy Notes  12.02.2016 @ approximately 1000 Patient approached LRT on unit and initiated conversation with LRT. Patient speech of normal tone and tenor an her thought process was vastly improved compared to previous encounters. Patient asked questions about d/c time, which LRT assured her LCSW would notify her of. Patient expressed excitement to d/c. LRT encouraged patient to continue to walk and to resume painting as a hobby, patient agreeable and stated she was going to start painting again.   Alice Adams, LRT/CTRS   Zhaniya Swallows L 02/09/2015 11:58 AM

## 2015-02-09 NOTE — Progress Notes (Signed)
Patient discharged home with prescriptions. Patient was stable and appreciative at that time. All papers and prescriptions were given and valuables returned. Verbal understanding expressed. Denies SI/HI and A/VH. Patient given opportunity to express concerns and ask questions.

## 2015-02-09 NOTE — Progress Notes (Signed)
  [facility] Adult Case Management Discharge Plan :  Will you be returning to the same living situation after discharge:  Yes,  home At discharge, do you have transportation home?: Yes,  husband Do you have the ability to pay for your medications: Yes,  insurance  Release of information consent forms completed and in the chart;  Patient's signature needed at discharge.  Patient to Follow up at: Follow-up Information    Follow up with [facility].   Why:   We faxed your information to [facility]. Please contact him at [phone] to schedule your next appointment. Ask for Prolactin check also. If you are unable to reach [place], call the main number listed above.   Contact information:   [address] [place], [place] [address] Phone: [phone] Fax: [phone]      Next level of care provider has access to [place]: unknown  Patient denies SI/HI: Yes,  yes    Safety Planning and Suicide Prevention discussed: Yes,  yes  Have you used any form of tobacco in the last 30 days? (Cigarettes, Smokeless Tobacco, Cigars, and/or Pipes): No  Has patient been referred to the Quitline?: N/A patient is not a smoker  [place], Barbaraann Rondo B 02/09/2015, 10:04 AM

## 2015-02-09 NOTE — BHH Suicide Risk Assessment (Signed)
[place] INPATIENT:  Family/Significant Other Suicide Prevention Education  Suicide Prevention Education:  Education Completed; No one has been identified by the patient as the family member/significant other with whom the patient will be residing, and identified as the person(s) who will aid the patient in the event of a mental health crisis (suicidal ideations/suicide attempt).  With written consent from the patient, the family member/significant other has been provided the following suicide prevention education, prior to the and/or following the discharge of the patient.  The suicide prevention education provided includes the following:  Suicide risk factors  Suicide prevention and interventions  National Suicide Hotline telephone number  [facility] assessment telephone number  [facility] Emergency Assistance [place] and/or Residential Mobile Crisis Unit telephone number  Request made of family/significant other to:  Remove weapons (e.g., guns, rifles, knives), all items previously/currently identified as safety concern.    Remove drugs/medications (over-the-counter, prescriptions, illicit drugs), all items previously/currently identified as a safety concern.  The family member/significant other verbalizes understanding of the suicide prevention education information provided.  The family member/significant other agrees to remove the items of safety concern listed above. The patient did not endorse SI at the time of admission, nor did the patient c/o SI during the stay here.  SPE not required. However, I did talk to husband, Saachi Giger, [place] [address] re: crises plan.  Roque Lias B 02/09/2015, 10:06 AM
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
## 2015-02-09 NOTE — Discharge Summary (Signed)
Physician Discharge Summary Note  Patient:  Alice Adams is an 54 y.o., female MRN:  [id number] DOB:  11/17/1960 Patient phone:  [phone] (home)  Patient address:   [address] [place] [address],  Total Time spent with patient: 45 minutes  Date of Admission:  02/03/2015 Date of Discharge: 02/09/2015  Reason for Admission:   History of Present Illness: PER Admission Note-54 year old female admitted in-voluntarily and alone. Pt. Had displayed bizarre behaviors at home and was brought th ED by husband. Pt. Is depressed , dis-oriented and confused on admission. Information was difficult to obtain from the pt. Due to her state of mind. Per report, she has been paranoid for the past month and increasingly depressed. No stressors are identified. Pt. Was given [place] in the ED last night due to tearing at her scrubs C/O bugs crawling on her skin. Pts. Father suicided 2 years ago. She lives with husband and has good family support. This is her first in-pt. Admission. Pt. Has difficulty forming sentences and organizing her thoughts . She can not put her thoughts and ideas into words, Which cause frustration for her as seen with stroke pts. Pt. Is focused on her home and family and is fearful of being in hospital. She is friendly and receptive to staff and understands that we are here to help her. . Pt. Has no known allergies. She has chronic back pain from un-known source . Pt. Denies any HX of any form of substance use or abuse.   On Evaluation:Alice Adams is awake, alert and oriented X4 , found talking to staff at the nursing station. Denies suicidal or homicidal ideation today. Denies auditory or visual hallucination and does not appear to be responding to internal stimuli. Patient interacts well with staff and others. Patient reports she is medication compliant without mediation side effects. States her depression 10/10. Patient is tearful and flat. Patient  states " feeling very anxious about this hospitalization. " I don't know why my family insisted that I come here." Reports her appetite is improving, states a recent unintentional weight loss. States she is resting well however is unsure if that is due to the nighttime medications. Patient expressed concerns regarding discharge. Support, encouragement and reassurance was provided.   Principal Problem: Major depressive disorder, recurrent episode, severe, with psychosis [facility]) Discharge Diagnoses: Patient Active Problem List   Diagnosis Date Noted  . Major depressive disorder, recurrent episode, severe, with psychosis ([place]) [F33.3] 02/03/2015  . Routine gynecological examination [Z01.419] 01/31/2013    Past Psychiatric History: MDD  Past Medical History:  Past Medical History  Diagnosis Date  . PONV (postoperative nausea and vomiting)   . Elevated cholesterol   . Asthma     NO PROBLEM SINCE 2006 -INVIRONMENTAL - SINCE MOVING FROM [place] TO [place]   . Malaria     TREATED FOR MALARIA 2009  . Anemia   . Skin abrasion     SMALL SCABS ON L LOWER LEG,L THIGH,RT THIGH - UNK CAUSE  . Thrombocytopenia [facility])     Past Surgical History  Procedure Laterality Date  . Dilation and curettage of uterus  2011  . Hemorroidectomy  2004  . Robotic assisted lap vaginal hysterectomy  01/24/2011    Procedure: ROBOTIC ASSISTED LAPAROSCOPIC VAGINAL HYSTERECTOMY;  Surgeon: Agnes Lawrence, MD;  Location: [facility] ORS;  Service: Gynecology;  Laterality: N/A;  . Skin surgery      removal basal cell carcinoma   Family History:  Family History  Problem Relation Age of Onset  . Diabetes Mother   . Hypertension Mother    Family Psychiatric  History: Reports that her father committed suicide "may have been due to depression" Patients reports she is unsure.  Social History:  History  Alcohol Use  . Yes    Comment: OCCASSIONAL     History  Drug Use No    Social History   Social History  . Marital  Status: Married    Spouse Name: N/A  . Number of Children: N/A  . Years of Education: N/A   Social History Main Topics  . Smoking status: Never Smoker   . Smokeless tobacco: Never Used  . Alcohol Use: Yes     Comment: OCCASSIONAL  . Drug Use: No  . Sexual Activity: Yes    Birth Control/ Protection: Condom   Other Topics Concern  . None   Social History Narrative    Hospital Course:   Alice Adams was admitted for Major depressive disorder, recurrent episode, severe, with psychosis ([place]) , with psychosis and crisis management.  Pt was treated discharged with the medications listed below under Medication List.  Medical problems were identified and treated as needed.  Home medications were restarted as appropriate.  Improvement was monitored by observation and Alice Adams 's daily report of symptom reduction.  Emotional and mental status was monitored by daily self-inventory reports completed by Alice Adams and clinical staff.         Alice Adams was evaluated by the treatment team for stability and plans for continued recovery upon discharge. Alice Adams 's motivation was an integral factor for scheduling further treatment. Employment, transportation, bed availability, health status, family support, and any pending legal issues were also considered during hospital stay. Pt was offered further treatment options upon discharge including but not limited to Residential, Intensive Outpatient, and Outpatient treatment.  Alice Adams will follow up with the services as listed below under Follow Up Information.     Upon completion of this admission the patient was both mentally and medically stable for discharge denying suicidal/homicidal ideation, auditory/visual/tactile hallucinations, delusional thoughts and paranoia.    Alice Adams responded well to treatment with Zyprexa, Prozac, Trazodone, and Ativan (PRN) without adverse effects. Pt demonstrated improvement without  reported or observed adverse effects to the point of stability appropriate for outpatient management. Pertinent labs include: Prolactin 47.5, LDL 125, for which outpatient follow-up is necessary for lab recheck as mentioned below. Reviewed CBC, CMP, BAL, and UDS; all unremarkable aside from noted exceptions.   Physical Findings: AIMS: Facial and Oral Movements Muscles of Facial Expression: None, normal Lips and Perioral Area: None, normal Jaw: None, normal Tongue: None, normal,Extremity Movements Upper (arms, wrists, hands, fingers): None, normal Lower (legs, knees, ankles, toes): None, normal, Trunk Movements Neck, shoulders, hips: None, normal, Overall Severity Severity of abnormal movements (highest score from questions above): None, normal Incapacitation due to abnormal movements: None, normal Patient's awareness of abnormal movements (rate only patient's report): No Awareness, Dental Status Current problems with teeth and/or dentures?: No Does patient usually wear dentures?: No  CIWA:  CIWA-Ar Total: 2 COWS:  COWS Total Score: 4  Musculoskeletal: Strength & Muscle Tone: within normal limits Gait & Station: normal Patient leans: N/A  Psychiatric Specialty Exam: Review of Systems  Psychiatric/Behavioral: Positive for depression. Negative for suicidal ideas and hallucinations. The patient is nervous/anxious and has insomnia.   All other systems reviewed and are negative.  Blood pressure 130/84, pulse 87, temperature 98 F (36.7 C), temperature source Oral, resp. rate 16, height 5' 4.57" (1.64 m), weight 52.617 kg (116 lb), last menstrual period 01/09/2011, SpO2 100 %.Body mass index is 19.56 kg/(m^2).  SEE MD PSE within the [place]   Have you used any form of tobacco in the last 30 days? (Cigarettes, Smokeless Tobacco, Cigars, and/or Pipes): No  Has this patient used any form of tobacco in the last 30 days? (Cigarettes, Smokeless Tobacco, Cigars, and/or Pipes) Yes, No  Metabolic  Disorder Labs:  Lab Results  Component Value Date   HGBA1C 6.2* 02/05/2015   MPG 131 02/05/2015   Lab Results  Component Value Date   PROLACTIN 47.5* 02/06/2015   Lab Results  Component Value Date   CHOL 194 02/06/2015   TRIG 112 02/06/2015   HDL 47 02/06/2015   CHOLHDL 4.1 02/06/2015   VLDL 22 02/06/2015   LDLCALC 125* 02/06/2015   LDLCALC 187* 01/31/2013    See Psychiatric Specialty Exam and Suicide Risk Assessment completed by Attending Physician prior to discharge.  Discharge destination:  Home  Is patient on multiple antipsychotic therapies at discharge:  No   Has Patient had three or more failed trials of antipsychotic monotherapy by history:  No  Recommended Plan for Multiple Antipsychotic Therapies: NA     Medication List    TAKE these medications      Indication   cyanocobalamin 500 MCG tablet  Take 1 tablet (500 mcg total) by mouth daily.   Indication:  Inadequate Vitamin B12     FLUoxetine 10 MG capsule  Commonly known as:  PROZAC  Take 3 capsules (30 mg total) by mouth daily.   Indication:  Depression     OLANZapine 7.5 MG tablet  Commonly known as:  ZYPREXA  Take 1 tablet (7.5 mg total) by mouth at bedtime.   Indication:  Schizophrenia     traZODone 50 MG tablet  Commonly known as:  DESYREL  Take 1 tablet (50 mg total) by mouth at bedtime as needed for sleep.   Indication:  Trouble Sleeping           Follow-up Information    Follow up with [facility].   Why:  with Dr. Candy Sledge for therapy and Dr. Milana Na for med managment. We faxed your information to [facility]. Please contact him at [phone] to schedule your next appointment.    Contact information:   [address] [place], [place] [address] Phone: [phone] Fax: [phone]      Follow-up recommendations:  Activity:  As tolerated Diet:  Heart healthy with low sodium Other:  Recheck for Prolactin level in 4 weeks.  Comments:   Take all medications  as prescribed. Keep all follow-up appointments as scheduled.  Do not consume alcohol or use illegal drugs while on prescription medications. Report any adverse effects from your medications to your primary care provider promptly.  In the event of recurrent symptoms or worsening symptoms, call 911, a crisis hotline, or go to the nearest emergency department for evaluation.   Signed: Benjamine Mola, FNP-BC 02/09/2015, 9:42 AM

## 2016-11-12 IMAGING — CT CT HEAD W/O CM
1 series · 16 of 30 positions shown, 20 images · non-contrast
Comparison: None.

CLINICAL DATA: Malaise.

EXAM:
CT HEAD WITHOUT CONTRAST
TECHNIQUE: Contiguous axial images were obtained from the base of the skull
through the vertex without intravenous contrast.

[Series 2: head 4.8 h37s · axial · 0.41mm/px · z∈[-155,-3]mm · 16 of 36 slices shown, 20 images]
[im 2/36  brain]
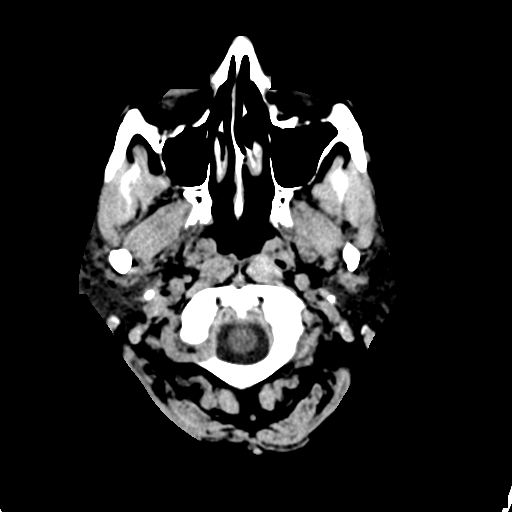
[im 2/36  bone]
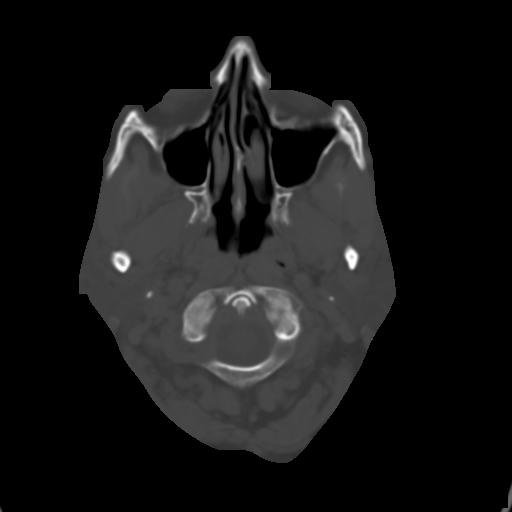
[im 4/36  brain]
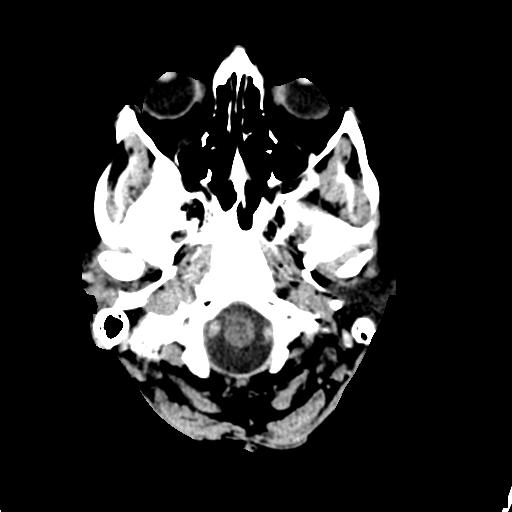
[im 7/36  brain]
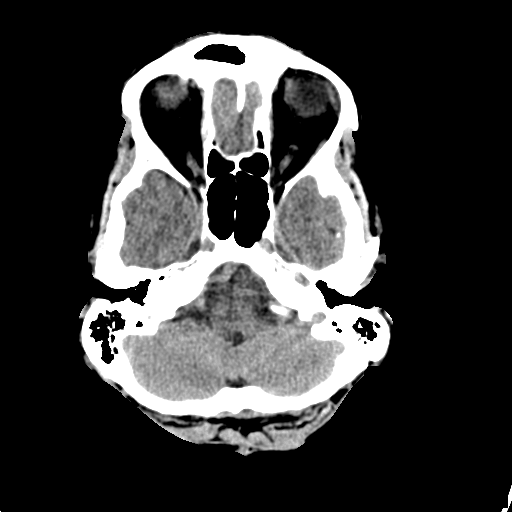
[im 9/36  brain]
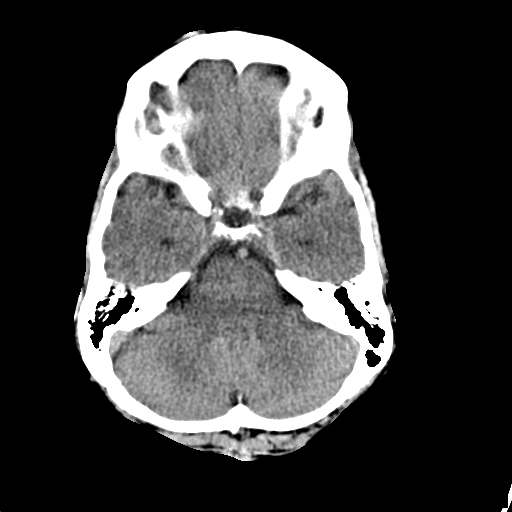
[im 10/36  brain]
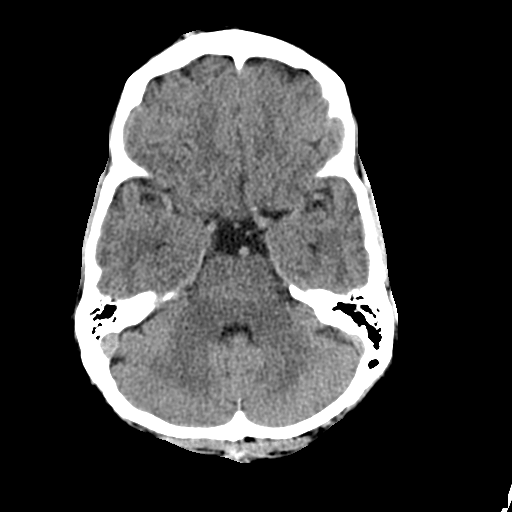
[im 10/36  bone]
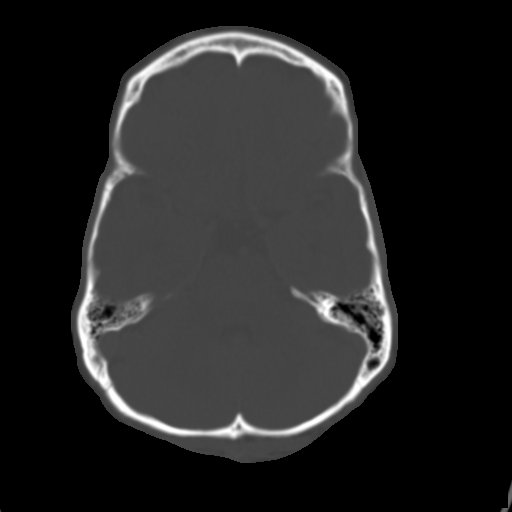
[im 13/36  brain]
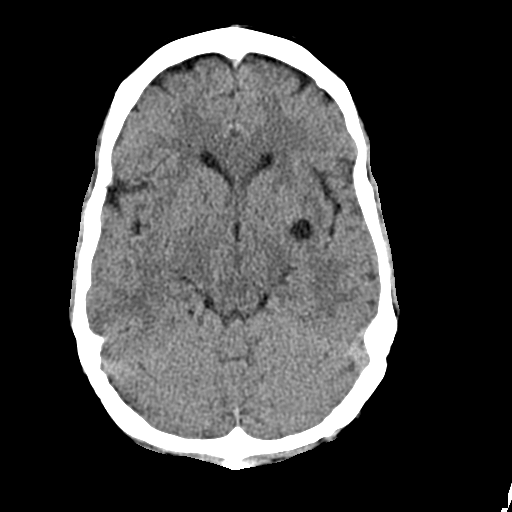
[im 15/36  brain]
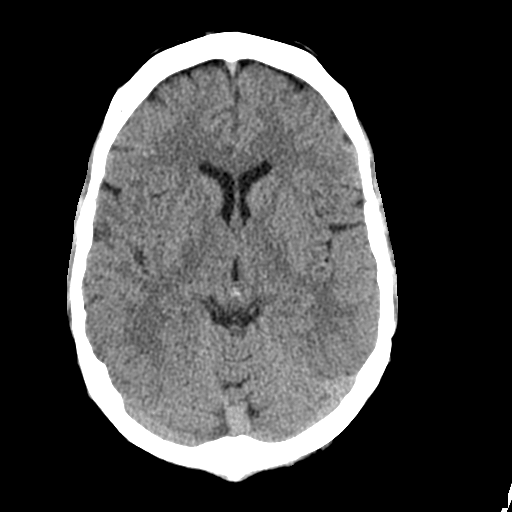
[im 17/36  brain]
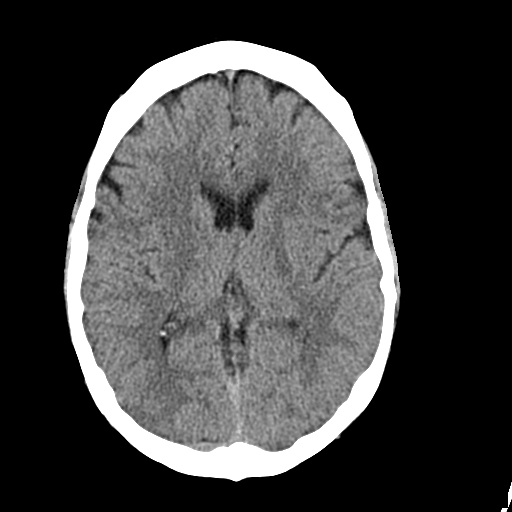
[im 19/36  brain]
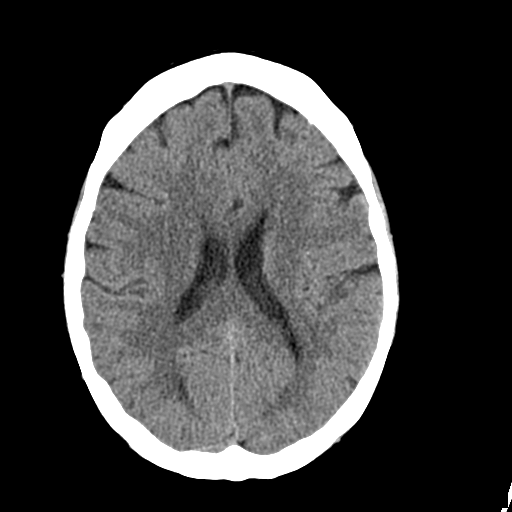
[im 19/36  bone]
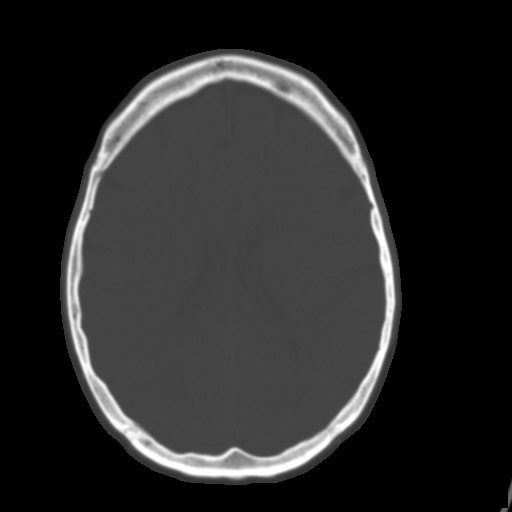
[im 21/36  brain]
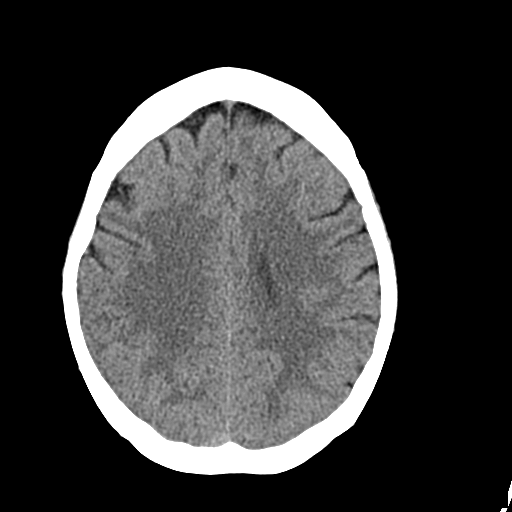
[im 23/36  brain]
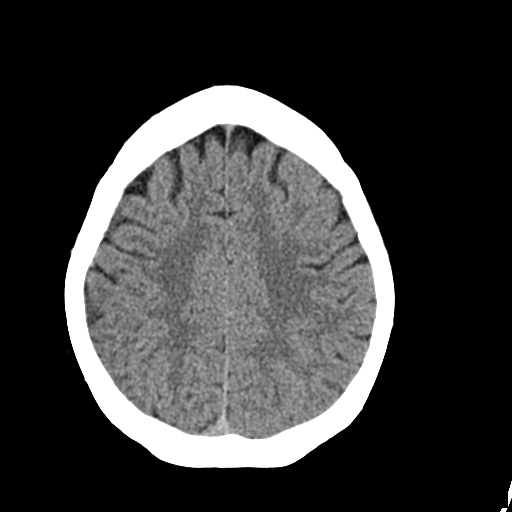
[im 26/36  brain]
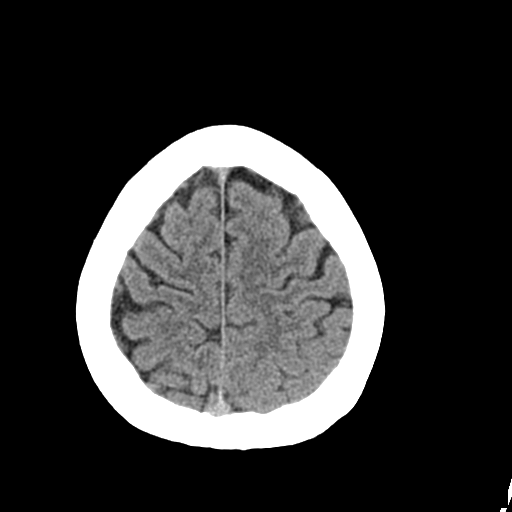
[im 27/36  brain]
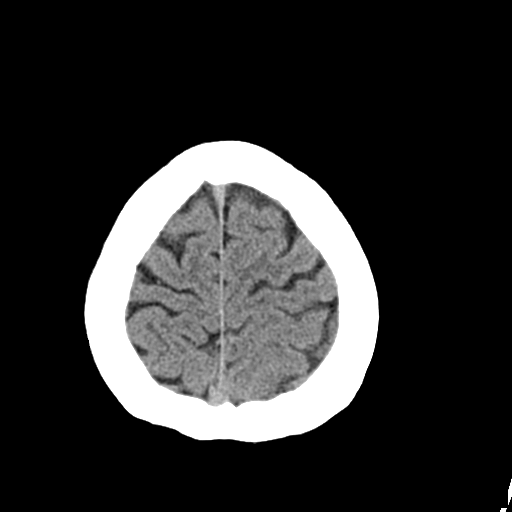
[im 27/36  bone]
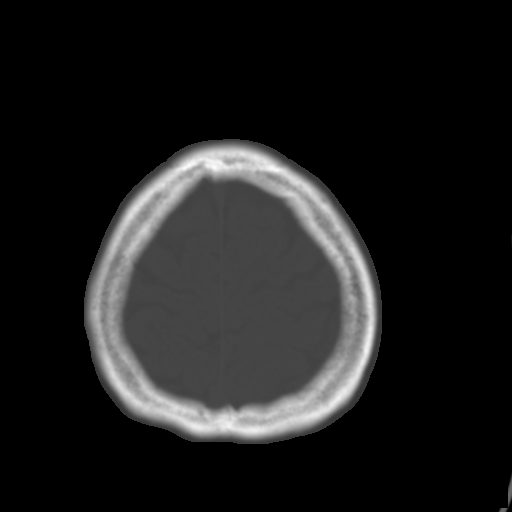
[im 29/36  brain]
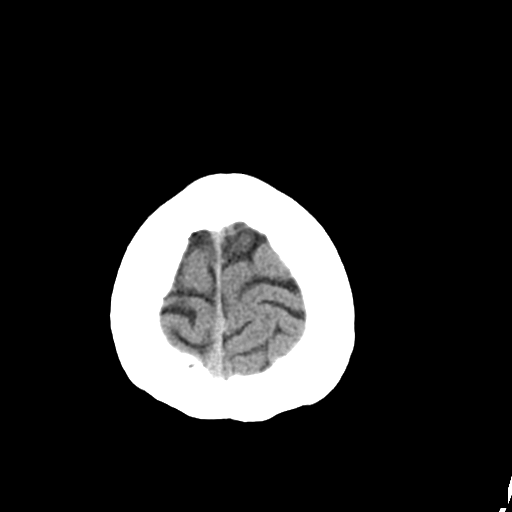
[im 32/36  brain]
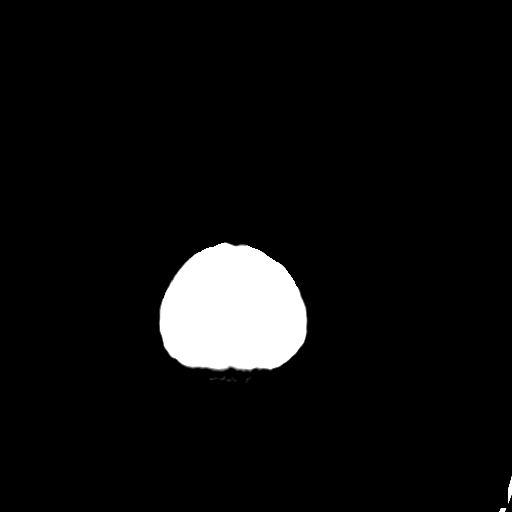
[im 34/36  brain]
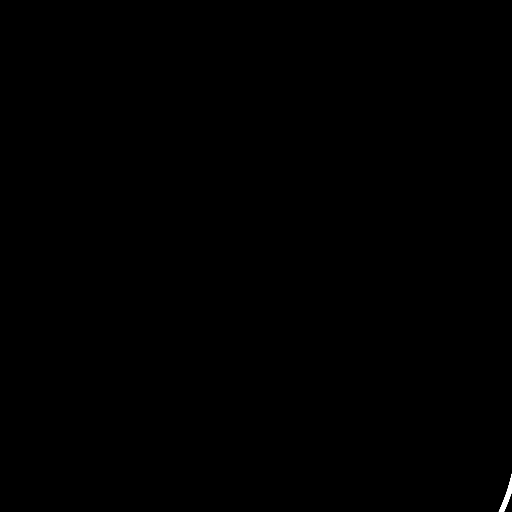

[16 of 30 positions shown; findings below may reference images not displayed]

FINDINGS: A presumed prominent VR space is noted caudal to the left basal
ganglia (image 13, series 2). Gray-white differentiation is
maintained without CT evidence of acute large territory infarct. No
intraparenchymal or extra-axial mass or hemorrhage. Normal size and
configuration of the ventricles and basilar cisterns. No midline
shift. Intracranial atherosclerosis. There is under pneumatization
of the left frontal sinus. The remaining paranasal sinuses and
mastoid air cells are normally aerated. No air-fluid levels.
Regional soft tissues appear normal. No displaced calvarial
fracture.
IMPRESSION: No acute intracranial process.

## 2017-06-18 IMAGING — CT CT HEAD W/O CM
1 series · 16 of 30 positions shown, 20 images · non-contrast
Comparison: 07/03/2014

CLINICAL DATA: Difficulty finding words.  Paranoid.

EXAM:
CT HEAD WITHOUT CONTRAST
TECHNIQUE: Contiguous axial images were obtained from the base of the skull
through the vertex without intravenous contrast.

[Series 2: headseq 4.8 h45s · axial · 0.43mm/px · z∈[+1206,+1360]mm · 16 of 36 slices shown, 20 images]
[im 2/36  brain]
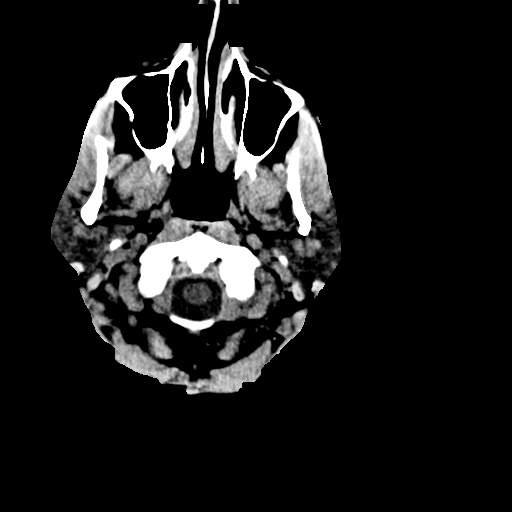
[im 2/36  bone]
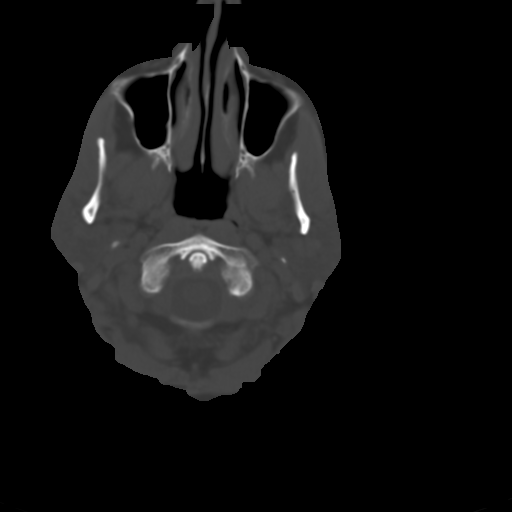
[im 4/36  brain]
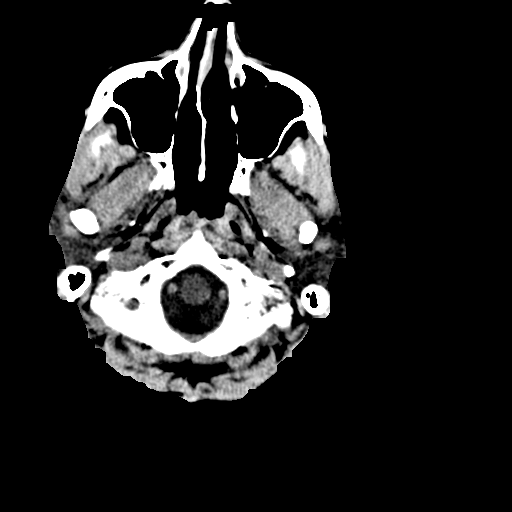
[im 7/36  brain]
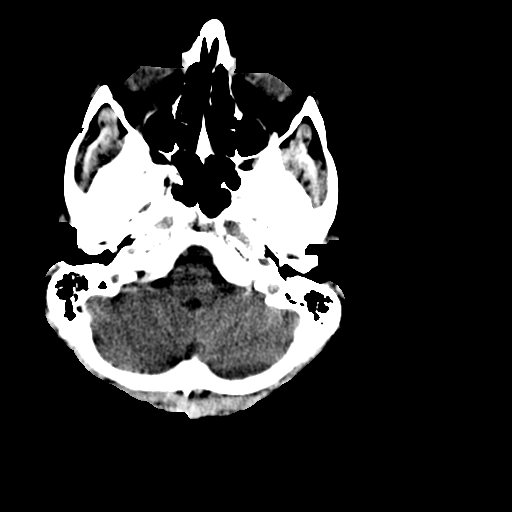
[im 9/36  brain]
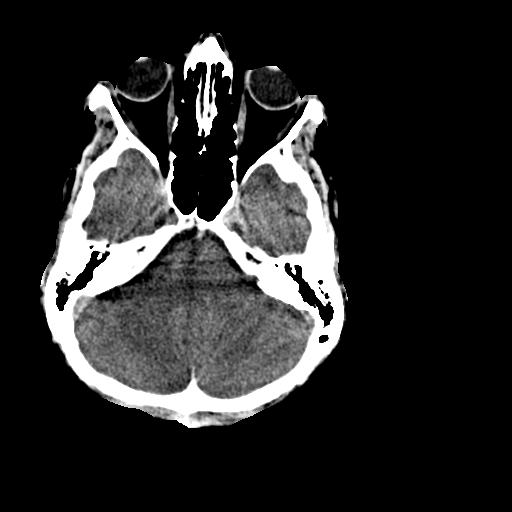
[im 10/36  brain]
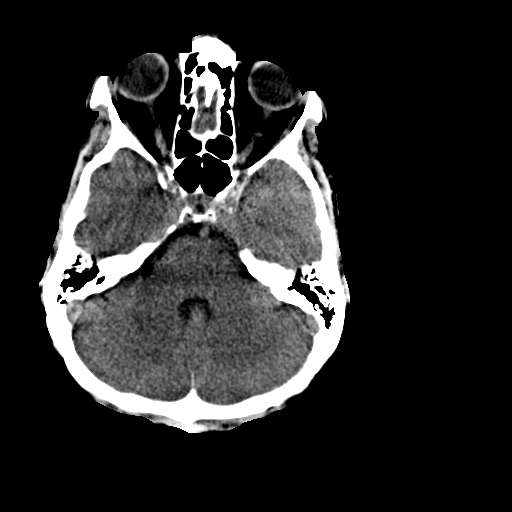
[im 10/36  bone]
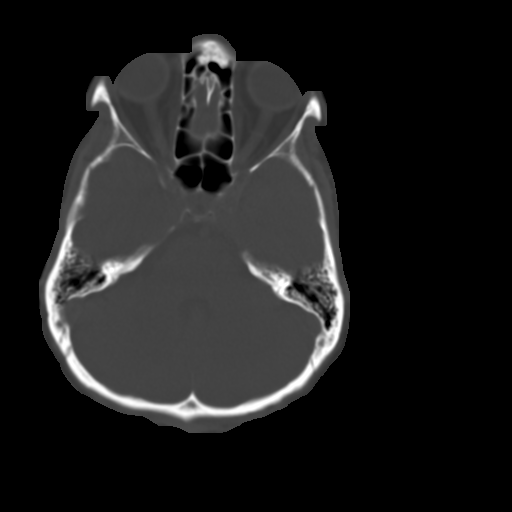
[im 13/36  brain]
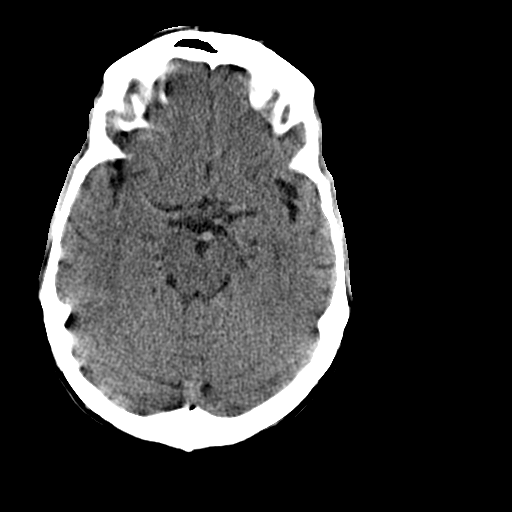
[im 15/36  brain]
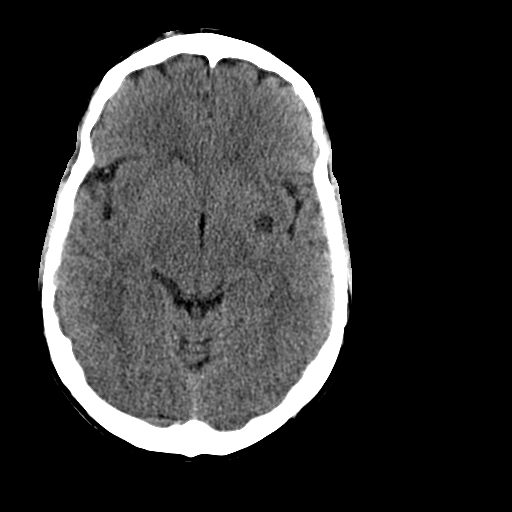
[im 17/36  brain]
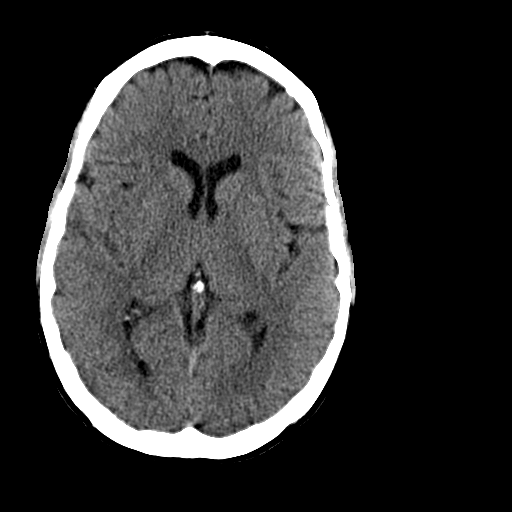
[im 19/36  brain]
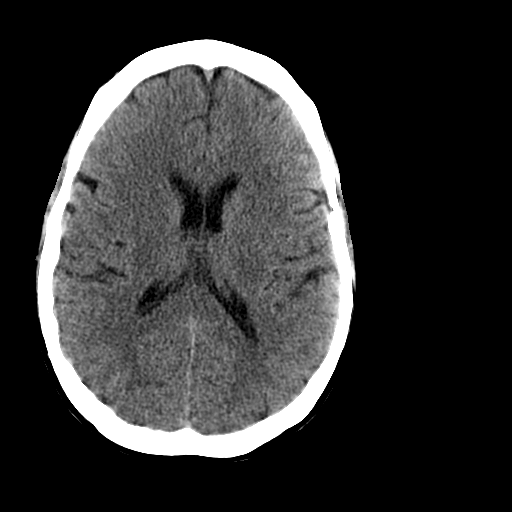
[im 19/36  bone]
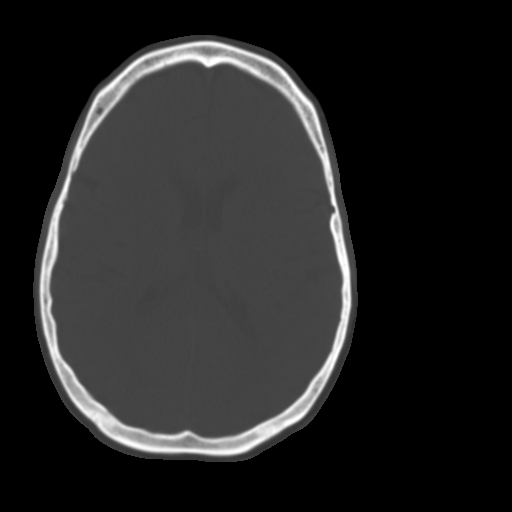
[im 21/36  brain]
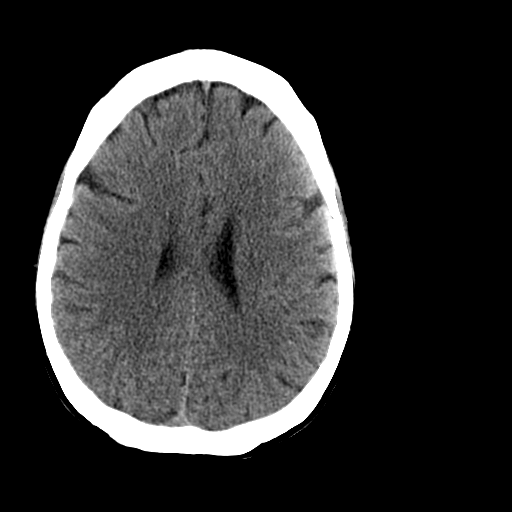
[im 23/36  brain]
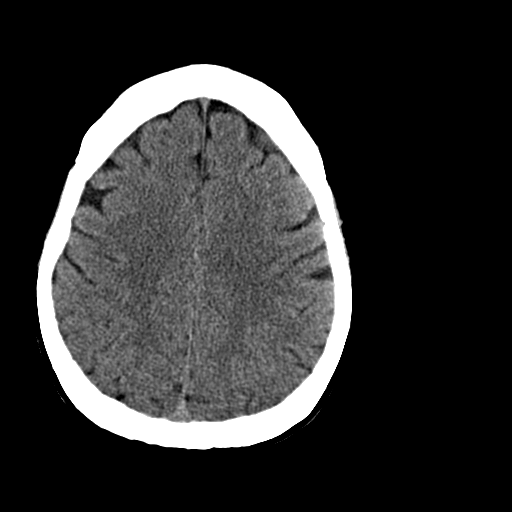
[im 26/36  brain]
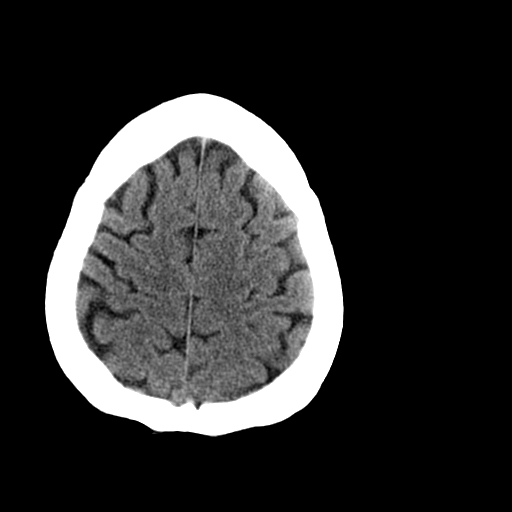
[im 27/36  brain]
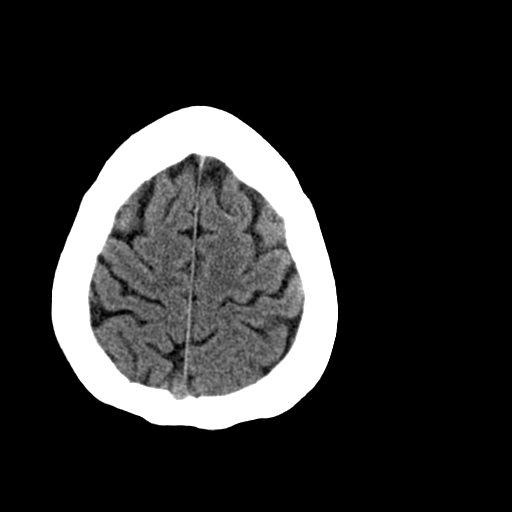
[im 27/36  bone]
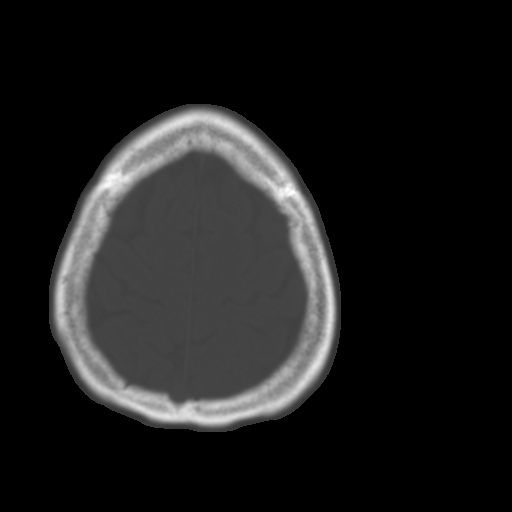
[im 29/36  brain]
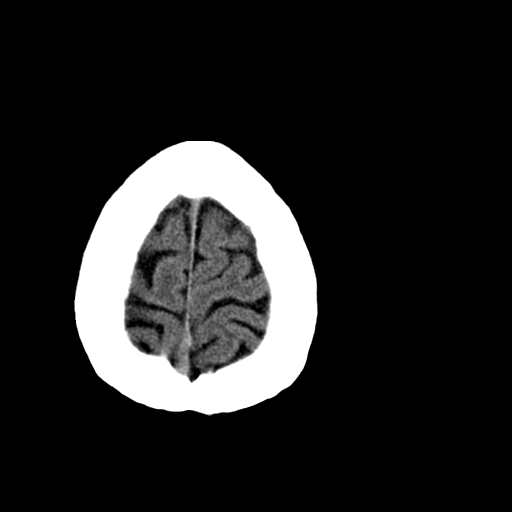
[im 32/36  brain]
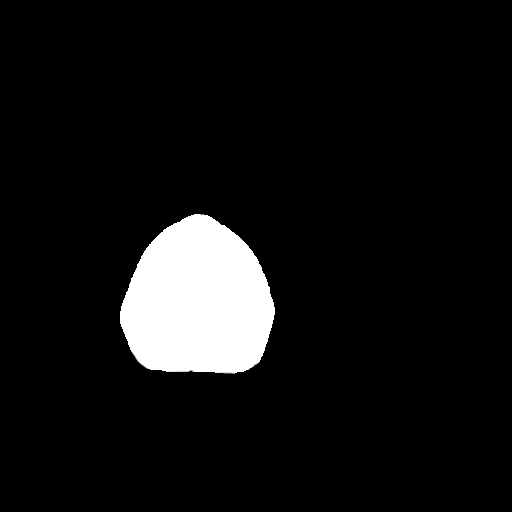
[im 34/36  brain]
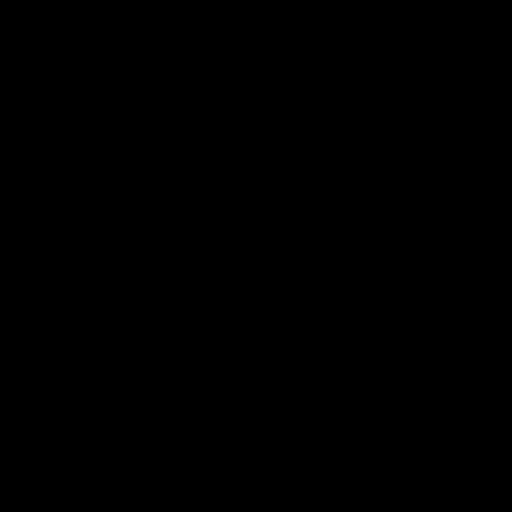

[16 of 30 positions shown; findings below may reference images not displayed]

FINDINGS: Skull and Sinuses:Negative for fracture or destructive process. The
visualized mastoids, middle ears, and imaged paranasal sinuses are
clear.

Orbits: No acute abnormality.

Brain: No evidence of acute infarction, hemorrhage, hydrocephalus,
or mass lesion/mass effect. Low-density below the left putamen that
is most consistent with dilated perivascular space, stable.
IMPRESSION: Negative head CT.

## 2022-04-07 ENCOUNTER — Other Ambulatory Visit: Payer: Self-pay | Admitting: Psychiatry
